# Patient Record
Sex: Female | Born: 1965 | Race: Black or African American | Hispanic: No | Smoking: Never smoker
Health system: Southern US, Community
[De-identification: ages and names within clinical notes are randomized; demographics above are authoritative.]

## PROBLEM LIST (undated history)

## (undated) DIAGNOSIS — N83209 Unspecified ovarian cyst, unspecified side: Secondary | ICD-10-CM

## (undated) DIAGNOSIS — Z9889 Other specified postprocedural states: Secondary | ICD-10-CM

## (undated) DIAGNOSIS — T8859XA Other complications of anesthesia, initial encounter: Secondary | ICD-10-CM

## (undated) DIAGNOSIS — I493 Ventricular premature depolarization: Secondary | ICD-10-CM

## (undated) DIAGNOSIS — D219 Benign neoplasm of connective and other soft tissue, unspecified: Secondary | ICD-10-CM

## (undated) DIAGNOSIS — T4145XA Adverse effect of unspecified anesthetic, initial encounter: Secondary | ICD-10-CM

## (undated) DIAGNOSIS — I452 Bifascicular block: Secondary | ICD-10-CM

## (undated) DIAGNOSIS — R112 Nausea with vomiting, unspecified: Secondary | ICD-10-CM

## (undated) DIAGNOSIS — I499 Cardiac arrhythmia, unspecified: Secondary | ICD-10-CM

## (undated) DIAGNOSIS — L03114 Cellulitis of left upper limb: Secondary | ICD-10-CM

## (undated) DIAGNOSIS — I10 Essential (primary) hypertension: Secondary | ICD-10-CM

## (undated) DIAGNOSIS — D869 Sarcoidosis, unspecified: Secondary | ICD-10-CM

## (undated) DIAGNOSIS — I209 Angina pectoris, unspecified: Secondary | ICD-10-CM

## (undated) DIAGNOSIS — M009 Pyogenic arthritis, unspecified: Secondary | ICD-10-CM

## (undated) HISTORY — PX: UTERINE FIBROID SURGERY: SHX826

## (undated) HISTORY — DX: Essential (primary) hypertension: I10

## (undated) HISTORY — DX: Unspecified ovarian cyst, unspecified side: N83.209

## (undated) HISTORY — DX: Bifascicular block: I45.2

## (undated) HISTORY — DX: Ventricular premature depolarization: I49.3

## (undated) HISTORY — DX: Sarcoidosis, unspecified: D86.9

## (undated) HISTORY — DX: Benign neoplasm of connective and other soft tissue, unspecified: D21.9

---
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
## 1984-12-13 HISTORY — PX: RIGHT OOPHORECTOMY: SHX2359

## 1984-12-13 HISTORY — PX: OVARIAN CYST REMOVAL: SHX89

## 1986-12-13 HISTORY — PX: APPENDECTOMY: SHX54

## 1989-12-13 HISTORY — PX: TUBAL LIGATION: SHX77

## 2001-06-06 ENCOUNTER — Emergency Department (HOSPITAL_COMMUNITY): Admission: EM | Admit: 2001-06-06 | Discharge: 2001-06-07 | Payer: Self-pay | Admitting: *Deleted

## 2001-07-12 ENCOUNTER — Encounter: Payer: Self-pay | Admitting: Obstetrics and Gynecology

## 2001-07-12 ENCOUNTER — Ambulatory Visit (HOSPITAL_COMMUNITY): Admission: RE | Admit: 2001-07-12 | Discharge: 2001-07-12 | Payer: Self-pay | Admitting: Obstetrics and Gynecology

## 2002-03-18 ENCOUNTER — Emergency Department (HOSPITAL_COMMUNITY): Admission: EM | Admit: 2002-03-18 | Discharge: 2002-03-18 | Payer: Self-pay | Admitting: Emergency Medicine

## 2002-06-25 ENCOUNTER — Encounter: Payer: Self-pay | Admitting: Obstetrics and Gynecology

## 2002-06-25 ENCOUNTER — Inpatient Hospital Stay (HOSPITAL_COMMUNITY): Admission: AD | Admit: 2002-06-25 | Discharge: 2002-06-25 | Payer: Self-pay | Admitting: Obstetrics and Gynecology

## 2003-08-02 ENCOUNTER — Other Ambulatory Visit: Admission: RE | Admit: 2003-08-02 | Discharge: 2003-08-02 | Payer: Self-pay | Admitting: Obstetrics & Gynecology

## 2004-07-27 ENCOUNTER — Encounter (INDEPENDENT_AMBULATORY_CARE_PROVIDER_SITE_OTHER): Payer: Self-pay | Admitting: Specialist

## 2004-07-27 ENCOUNTER — Inpatient Hospital Stay (HOSPITAL_COMMUNITY): Admission: RE | Admit: 2004-07-27 | Discharge: 2004-07-29 | Payer: Self-pay | Admitting: *Deleted

## 2005-06-30 ENCOUNTER — Other Ambulatory Visit: Admission: RE | Admit: 2005-06-30 | Discharge: 2005-06-30 | Payer: Self-pay | Admitting: Obstetrics and Gynecology

## 2007-12-22 ENCOUNTER — Emergency Department (HOSPITAL_COMMUNITY): Admission: EM | Admit: 2007-12-22 | Discharge: 2007-12-22 | Payer: Self-pay | Admitting: Family Medicine

## 2010-10-24 ENCOUNTER — Emergency Department (HOSPITAL_COMMUNITY): Admission: EM | Admit: 2010-10-24 | Discharge: 2010-10-24 | Payer: Self-pay | Admitting: Emergency Medicine

## 2010-10-26 ENCOUNTER — Emergency Department (HOSPITAL_COMMUNITY): Admission: EM | Admit: 2010-10-26 | Discharge: 2010-10-26 | Payer: Self-pay | Admitting: Family Medicine

## 2011-01-02 ENCOUNTER — Encounter: Payer: Self-pay | Admitting: Obstetrics and Gynecology

## 2011-01-03 ENCOUNTER — Encounter: Payer: Self-pay | Admitting: Obstetrics and Gynecology

## 2011-04-30 NOTE — H&P (Signed)
[name], [name]                           ACCOUNT NO.:  [id number]   MEDICAL RECORD NO.:  [id number]                   PATIENT TYPE:  INP   LOCATION:  NA                                   [facility]  [facility]   PHYSICIAN:  [name], M.D.               DATE OF BIRTH:  [birth year]   DATE OF ADMISSION:  07/27/2004  DATE OF DISCHARGE:                                HISTORY & PHYSICAL   PREOPERATIVE DIAGNOSES:  Symptomatic uterine fibroids, desires conservative  surgical therapy.   POSTOPERATIVE DIAGNOSES:  Symptomatic uterine fibroids, desires conservative  surgical therapy.   PROCEDURE:  Abdominal myomectomy.   HISTORY OF PRESENT ILLNESS:  A 45 year old African-American female status  post tubal ligation with complaints of heavy menstrual bleeding,  dysmenorrhea and uterine fibroids.  She is interested in one additional  pregnancy. She states she has a history of endometriosis noted at previous  ovarian cyst removal.  Also status post tubal ligation. The patient has  declined a combined procedure with a fertility specialist for myomectomy and  tubal reversal despite my recommendations to do so.  Her main complaint at  this time is the pain and bleeding and does not desire to address the  fertility issues at this time.   Pelvic ultrasound in the office shows three dominant fibroids in the 5-9 cm  range. The endometrium appeared thin.   PAST MEDICAL HISTORY:  Hypertension, endometriosis and fibroids.   PAST SURGICAL HISTORY:  Appendectomy, ovarian cyst removal, TAB x1, vaginal  delivery x1, tubal ligation.   MEDICATIONS:  Hydrochlorothiazide.   ALLERGIES:  None.   SOCIAL HISTORY:  No alcohol, tobacco or other drugs.   FAMILY HISTORY:  Noncontributory.   REVIEW OF SYMPTOMS:  Otherwise negative.   PHYSICAL EXAMINATION:  VITAL SIGNS:  Blood pressure 140/100, weight 284,  height 5 foot 10.  GENERAL:  Alert in no acute distress.  SKIN:  Warm and dry without lesions.  HEART:  Regular rate and rhythm.  LUNGS:  Clear to auscultation.  ABDOMEN:  Obese, liver and spleen are normal.  No hernia.  A vertical  midline incision noted without hernia. Mass is palpable at the umbilicus.  LYMPH NODE SURVEY:  Negative in the neck, axilla and groin.  BREASTS:  No dominant masses, nipple discharge or adenopathy.  PELVIC:  Normal external genitalia, vagina and cervix normal. The uterus is  enlarged consistent with fibroids approximately 20 week size, no adnexal  masses or tenderness.  Recent Pap smear in the office was normal.   ASSESSMENT:  Symptomatic uterine fibroids with menorrhagia and dysmenorrhea.  Desires conservative surgical management.   PLAN:  Abdominal myomectomy.  The operative risk discussed including  infection, bleeding, damage to surrounding organs, and potential need for  conversion to hysterectomy due to bleeding. All questions answered. The  patient wished to proceed.  [name], M.D.    WBD/MEDQ  D:  07/23/2004  T:  07/23/2004  Job:  [id number]

## 2011-04-30 NOTE — Discharge Summary (Signed)
[name], [name]                           ACCOUNT NO.:  [id number]   MEDICAL RECORD NO.:  [id number]                   PATIENT TYPE:  INP   LOCATION:  9310                                 [facility]ILITY:  [facility]   PHYSICIAN:  [place] B. Earlene Plater, M.D.               DATE OF BIRTH:  November 08, 1966   DATE OF ADMISSION:  07/27/2004  DATE OF DISCHARGE:                                 DISCHARGE SUMMARY   PREOPERATIVE DIAGNOSES:  1. Symptomatic uterine fibroids.  2. Desires uterine retention.   POSTOPERATIVE DIAGNOSES:  1. Symptomatic uterine fibroids.  2. Desires uterine retention.   PROCEDURE:  Abdominal myomectomy.   HISTORY OF PRESENT ILLNESS:  For complete details please see the History and  Physical in the chart.  Briefly, the patient is admitted for surgical  management of symptomatic uterine fibroids not responding to medical  management with associated menorrhagia and dysmenorrhea.  The patient  desires the possibility for future fertility and therefore requesting  abdominal myomectomy.   HOSPITAL COURSE:  On the day of admission the patient underwent abdominal  myomectomy.  The findings at the time of surgery included an approximately  20-week-size fibroid uterus with multiple subserosal myomas.  In addition,  there was a single, large, pedunculated myoma from the fundus.  The uterine  cavity was entered on removing a submucosal fibroid.  The uterus was  approximately debulked by 50% from its preoperative volume.  As there were  deeper, more inferior myomas that in my opinion would have significantly  increased the chance of bleeding given their proximity to the uterine  arteries and therefore these were left in situ given the patient's desire  for uterine retention.   Postoperatively the patient rapidly regained her ability to ambulate, void,  and tolerate a regular diet.  She was discharged home on postoperative day  #2 in satisfactory condition.   DISCHARGE MEDICATIONS:  1.  Ferrous sulfate 325 mg p.o. b.i.d.  2. Tylox one to two p.o. q.4-6h. p.r.n. pain.   DISPOSITION AT DISCHARGE:  Satisfactory.   DISCHARGE INSTRUCTIONS:  Standard instructions were given.  Follow up 1 week  at [facility] for staple removal.                                               Gerri Spore B. Earlene Plater, M.D.   WBD/MEDQ  D:  07/29/2004  T:  07/29/2004  Job:  [id number]

## 2011-04-30 NOTE — Op Note (Signed)
[name], [name]                           ACCOUNT NO.:  [id number]   MEDICAL RECORD NO.:  [id number]                   PATIENT TYPE:  INP   LOCATION:  9399                                 [facility]ILITY[facility]   PHYSICIAN:  Gerri Spore B. Earlene Plater, M.D.               DATE OF BIRTH:  [birth year]   DATE OF PROCEDURE:  07/27/2004  DATE OF DISCHARGE:                                 OPERATIVE REPORT   PREOPERATIVE DIAGNOSIS:  Abnormal bleeding.  Uterine fibroids.   POSTOPERATIVE DIAGNOSIS:  Abnormal bleeding.  Uterine fibroids.   OPERATION PERFORMED:  Abdominal myomectomy.   SURGEON:  Chester Holstein. Earlene Plater, M.D.   ASSISTANT:  Genia Del, M.D.   ANESTHESIA:  General.   FINDINGS:  Approximately 20-week size fibroid uterus with multiple  subserosal fibroids and a pedunculated anterior fibroid.  Tubes and ovaries  were poorly visualized due to the bulk of the uterus.  There were several  fibroids that could not be safely removed, primarily located in the anterior  lower uterine segment and posteriorly.  Concern was for excessive bleeding  and injury to surrounding organs.   ESTIMATED BLOOD LOSS:  75 mL.   SPECIMENS:  Approximately 340 g of uterine fibroids removed.   INDICATIONS FOR PROCEDURE:  Patient with a history of heavy menstrual  bleeding associated with dysmenorrhea and known uterine fibroids.  Has had a  previous tubal ligation but is considering in vitro with donor eggs or tubal  reversal and therefore wished to retain fertility but desired operative  management of her symptomatic fibroids.  The patient was informed prior to  surgery that it would be possible that bleeding was require hysterectomy or  that complete debulking might not occur.  Operative risks were discussed  including infection, bleeding, damage to surrounding organs.   DESCRIPTION OF PROCEDURE:  The patient was taken to the operating room and  general anesthesia obtained.  She was prepped and draped in standard  fashion.  Inflow catheter inserted into the bladder.  Vertical midline  incision made and carried to the fascia.  The fascia was divided sharply and  elevated.  The posterior sheath and peritoneum were entered sharply.  There  were underlying omental adhesions from a previous vertical midline scar  which were taken down with cautery and Metzenbaum scissors.  The uterus was  elevated through the incision and the above findings noted.  There appeared  to be two or three dominant fundal fibroids in addition to the pedunculated  fundal fibroid that were at least half of the bulk of her uterus.  There  were some more deeply located inferior fibroids around the lower uterine  segment anteriorly and posteriorly.   The fundus was injected with dilute vasopressin and incised with the Bovie.  The capsule was developed with sharp and blunt technique and the most  dominant fibroid was removed. This allowed access to deeper fibroids which  were  removed in a similar fashion.  The uterine cavity was entered with the  deepest of these approximately 2 cm defects.  The pedunculated myoma was  also removed from the fundus with the Bovie.   After further inspection, I did not feel it was safe to remove the  additional fibroids given the patient's strong desire to retain fertility.  In addition, access to these would be difficult due to her morbid obesity.  Therefore, these fibroids were left in situ.  The endometrial cavity was  closed with a running stitch of 2-0 Vicryl.  The deeper myometrial tissue  was reapproximated with interrupted figure-of-eight sutures of o0 Vicryl.  The serosa was reapproximated with a baseball stitch of 2-0 Vicryl.  Hemostasis obtained.  A layer of InterCeed was placed over the uterine  incision and the bowel returned to the anatomical position and omentum laid  overtop of the InterCeed.   The fascia was closed with a running stitch of double stranded 0 PDS suture.  The  subcutaneous tissue was reapproximated with 0 plain suture.  The skin  was closed with staples.  The patient tolerated the procedure well.  There  were no complications.  She was taken to recovery room awake, alert in  stable condition.                                               Gerri Spore B. Earlene Plater, M.D.    WBD/MEDQ  D:  07/27/2004  T:  07/27/2004  Job:  [id number]

## 2011-05-27 ENCOUNTER — Ambulatory Visit (INDEPENDENT_AMBULATORY_CARE_PROVIDER_SITE_OTHER): Payer: Managed Care, Other (non HMO) | Admitting: Cardiology

## 2011-05-27 ENCOUNTER — Encounter: Payer: Self-pay | Admitting: Cardiology

## 2011-05-27 VITALS — BP 130/78 | HR 86 | Ht 71.0 in | Wt 298.5 lb

## 2011-05-27 DIAGNOSIS — I493 Ventricular premature depolarization: Secondary | ICD-10-CM

## 2011-05-27 DIAGNOSIS — I4949 Other premature depolarization: Secondary | ICD-10-CM

## 2011-05-27 DIAGNOSIS — I452 Bifascicular block: Secondary | ICD-10-CM

## 2011-05-27 DIAGNOSIS — I10 Essential (primary) hypertension: Secondary | ICD-10-CM | POA: Insufficient documentation

## 2011-05-27 NOTE — Assessment & Plan Note (Addendum)
She has frequent PVCs. We need to rule out structural heart disease especially given the history of hypertrophic cardiomyopathy in her son. If she has no structural heart disease then these PVCs may be benign and we may not need to treat them unless she has significant symptoms in which case a beta blocker may be warranted. We will schedule her for an echocardiogram.

## 2011-05-27 NOTE — Progress Notes (Signed)
[name] Date of Birth: 02-15-1966   History of Present Illness: [name] is a pleasant 50 her old [place] American female who is seen at the request of Dr. Gertie Gowda for bradycardia and an abnormal ECG. Patient reports that recently she has been feeling weak and tired. She thought that her sugars were high but this was checked and was normal. She was checking her blood pressure at home and her machine registered a pulse rate of 40. She was seen by Dr. Gertie Gowda and ECG was abnormal. She states she has had a few episodes where she felt like she might pass out. These occurred when she was going up her stairs. She has no known history of cardiac disease. She denies any significant palpitations. It is interesting to note that her son has been diagnosed with hypertrophic cardiomyopathy.  Current Outpatient Prescriptions on File Prior to Visit  Medication Sig Dispense Refill  . Ascorbic Acid (VITAMIN C PO) Take 1 tablet by mouth daily.        Marland Kitchen lisinopril-hydrochlorothiazide (PRINZIDE,ZESTORETIC) 10-12.5 MG per tablet Take 1 tablet by mouth daily.        . Omega-3 Fatty Acids (FISH OIL PO) Take 1 capsule by mouth daily.        Marland Kitchen terbinafine (LAMISIL) 250 MG tablet       . Thiamine HCl (VITAMIN B-1 PO) Take by mouth daily.          No Known Allergies  Past Medical History  Diagnosis Date  . Hypertension   . Ovarian cyst   . Fibroid tumor     Past Surgical History  Procedure Date  . Ovarian cyst removal   . Tonsillectomy   . Appendectomy   . Total abdominal hysterectomy w/ bilateral salpingoophorectomy     right    History  Smoking status  . Never Smoker   Smokeless tobacco  . Not on file    History  Alcohol Use No    Family History  Problem Relation Age of Onset  . Anemia Sister   . Anemia Sister   . Diabetes Mother   . Heart disease Mother   . Hypertension Mother   . Diabetes Father   . Hypertension Father   . Scleroderma Father     Review of  Systems: The review of systems is positive for intermittent lightheadedness.  She denies any chest pain or shortness of breath. She's had no history of TIA or stroke. She does have a history of hypertension but this has been controlled.All other systems were reviewed and are negative.  Physical Exam: BP 130/78  Pulse 86  Ht 5\' 11"  (1.803 m)  Wt 298 lb 8 oz (135.399 kg)  BMI 41.63 kg/m2 She is a pleasant overweight black female in no acute distress. Normocephalic, atraumatic. Pupils are equal round and reactive to light and accommodation. Extraocular movements are clear. Sclera are clear. Oropharynx is clear with good dentition. Neck is supple without JVD, adenopathy, thyromegaly, or bruits. Lungs are clear. Cardiac exam reveals a regular rate and rhythm with frequent extrasystoles. She has no gallop, murmur, or click. Abdomen is obese, soft, nontender. She has no hepatosplenomegaly masses or bruits. Extremities are without edema. Pulses are 2+ and symmetric. Skin is warm and dry. She is alert and oriented x3. Cranial nerves II through XII are intact. 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 She has frequent PVCs.  Assessment /  Plan:

## 2011-05-27 NOTE — Assessment & Plan Note (Signed)
She has a right bundle branch block and left anterior fascicular block. Her pulse rate is normal today. I suspect that her low pulse reading on her home blood pressure monitor was related to her frequent PVCs that did not register. We will have her wear a Holter monitor to rule out significant bradycardia or pauses. Patient reports she has had lab work done recently including thyroid studies and chemistries.

## 2011-05-27 NOTE — Patient Instructions (Signed)
We will have you wear a monitor for 24 hours.  We will schedule you for an Echocardiogram.  We will follow up with you after these studies.

## 2011-05-28 ENCOUNTER — Encounter: Payer: Self-pay | Admitting: Cardiology

## 2011-06-03 ENCOUNTER — Other Ambulatory Visit (HOSPITAL_COMMUNITY): Payer: Managed Care, Other (non HMO) | Admitting: Radiology

## 2011-06-07 ENCOUNTER — Emergency Department (HOSPITAL_COMMUNITY): Payer: Managed Care, Other (non HMO)

## 2011-06-07 ENCOUNTER — Telehealth: Payer: Self-pay | Admitting: *Deleted

## 2011-06-07 ENCOUNTER — Inpatient Hospital Stay (HOSPITAL_COMMUNITY)
Admission: EM | Admit: 2011-06-07 | Discharge: 2011-06-08 | DRG: 313 | Disposition: A | Discharge status: Home / Self Care | Payer: Managed Care, Other (non HMO) | Attending: Cardiology | Admitting: Cardiology

## 2011-06-07 DIAGNOSIS — R0989 Other specified symptoms and signs involving the circulatory and respiratory systems: Secondary | ICD-10-CM | POA: Diagnosis present

## 2011-06-07 DIAGNOSIS — I10 Essential (primary) hypertension: Secondary | ICD-10-CM | POA: Diagnosis present

## 2011-06-07 DIAGNOSIS — I452 Bifascicular block: Secondary | ICD-10-CM | POA: Diagnosis present

## 2011-06-07 DIAGNOSIS — R55 Syncope and collapse: Secondary | ICD-10-CM | POA: Diagnosis present

## 2011-06-07 DIAGNOSIS — R0789 Other chest pain: Principal | ICD-10-CM | POA: Diagnosis present

## 2011-06-07 DIAGNOSIS — R0609 Other forms of dyspnea: Secondary | ICD-10-CM | POA: Diagnosis present

## 2011-06-07 DIAGNOSIS — R079 Chest pain, unspecified: Secondary | ICD-10-CM

## 2011-06-07 LAB — DIFFERENTIAL
Basophils Relative: 1 % (ref 0–1)
Eosinophils Absolute: 0.4 10*3/uL (ref 0.0–0.7)
Monocytes Relative: 7 % (ref 3–12)
Neutrophils Relative %: 58 % (ref 43–77)

## 2011-06-07 LAB — BASIC METABOLIC PANEL
CO2: 27 mEq/L (ref 19–32)
Calcium: 9.9 mg/dL (ref 8.4–10.5)
Creatinine, Ser: 0.88 mg/dL (ref 0.50–1.10)
Glucose, Bld: 99 mg/dL (ref 70–99)

## 2011-06-07 LAB — CK TOTAL AND CKMB (NOT AT ARMC): Total CK: 343 U/L — ABNORMAL HIGH (ref 7–177)

## 2011-06-07 LAB — TROPONIN I: Troponin I: <0.3 ng/mL (ref <0.30)

## 2011-06-07 LAB — CBC
MCH: 28.5 pg (ref 26.0–34.0)
Platelets: 252 10*3/uL (ref 150–400)
RBC: 4.99 MIL/uL (ref 3.87–5.11)
WBC: 6.4 10*3/uL (ref 4.0–10.5)

## 2011-06-07 NOTE — Telephone Encounter (Signed)
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 Pt was referred to er per dr Elease Hashimoto. Pt said she would go.

## 2011-06-08 ENCOUNTER — Inpatient Hospital Stay (HOSPITAL_COMMUNITY): Payer: Managed Care, Other (non HMO)

## 2011-06-08 DIAGNOSIS — R072 Precordial pain: Secondary | ICD-10-CM

## 2011-06-08 LAB — CARDIAC PANEL(CRET KIN+CKTOT+MB+TROPI)
CK, MB: 2.4 ng/mL (ref 0.3–4.0)
Relative Index: 1 (ref 0.0–2.5)
Total CK: 272 U/L — ABNORMAL HIGH (ref 7–177)
Total CK: 243 U/L — ABNORMAL HIGH (ref 7–177)
Troponin I: <0.3 ng/mL (ref <0.30)

## 2011-06-08 LAB — D-DIMER, QUANTITATIVE: D-Dimer, Quant: 0.99 ug/mL-FEU — ABNORMAL HIGH (ref 0.00–0.48)

## 2011-06-08 MED ORDER — IOHEXOL 350 MG/ML SOLN
75.0000 mL | Freq: Once | INTRAVENOUS | Status: AC | PRN
  Administered 2011-06-08: 75 mL via INTRAVENOUS

## 2011-06-10 ENCOUNTER — Other Ambulatory Visit (HOSPITAL_COMMUNITY): Payer: Managed Care, Other (non HMO) | Admitting: Radiology

## 2011-06-10 NOTE — Consult Note (Signed)
NAMEREMEDY, CORPORAN NO.:  [id number]  MEDICAL RECORD NO.:  [id number]  LOCATION:  2041                         FACILITY:  [facility]  PHYSICIAN:  Madolyn Frieze. Jens Som, MD, FACCDATE OF BIRTH:  09/04/1966  DATE OF CONSULTATION:  06/07/2011 DATE OF DISCHARGE:                                CONSULTATION   HISTORY:  The patient is a 44-year female with past medical history of hypertension who I am asked to evaluate for chest pain.  The patient was recently seen by her primary care physician and noted to be bradycardic. She was referred to Dr. Peter Swaziland and he saw her on May 27, 2011. At that time, her heart rate was normal.  He did note that her son has hypertrophic obstructive cardiomyopathy.  He ordered a Holter monitor which has been removed, but the results are not available.  He also ordered an echocardiogram which is to be performed on June 06, 2011. The patient presented to the emergency room tonight, predominantlycomplained of chest pain.  The pain is in the substernal location without radiation.  It is described as an "ache."  It began on June 21 and has been continuous since.  It is not pleuritic or positional nor is it related to food.  It is not exertional.  There were no associated symptoms.  She also describes a "sharp pain" when she takes of breath. Because of her chest pain, Cardiology was also asked to further evaluate.  Note, she also describes increased dyspnea on exertion for 6 months as well as fatigue.  She also has had occasional dizzy spells, for which the monitor was ordered.  These dizzy spells lasts approximately 15 minutes and resolved spontaneously.  There is no associated palpitations.  There is no associated chest pain or shortness of breath.  She does not have risk factors for a pulmonary embolus.  MEDICATIONS:  Lisinopril 10 mg daily.  ALLERGIES:  She has no known drug allergies.  SOCIAL HISTORY:  She does not smoke nor does she  consume alcohol.  FAMILY HISTORY:  Significant for a son who had hypertrophic obstructive cardiomyopathy.  PAST MEDICAL HISTORY:  Significant for hypertension.  There is no diabetes mellitus or hyperlipidemia.  She does have a history of ovarian cyst removed.  She has also had tonsillectomy and appendectomy.  REVIEW OF SYSTEMS:  She denies any headaches or fevers or chills.  She has had a nonproductive cough.  There is no recent hemoptysis.  There is no dysphagia, odynophagia, melena, or hematochezia.  There is no dysuria or hematuria.  There is no rash or seizure activity.  There is no orthopnea, PND, or pedal edema.  She does have dyspnea on exertion as well as fatigue.  Remaining systems are negative.  PHYSICAL EXAMINATION:  VITAL SIGNS:  Shows a blood pressure of 131/66 and her pulse is 82.  Her temperature is 98.4. GENERAL:  She is well-developed and somewhat obese.  She is no acute distress at present.  Her skin is warm and dry.  She does not appear to be depressed.  There is no peripheral clubbing. BACK:  Normal. HEENT:  Normal with normal eyelids. NECK:  Supple with  normal upstroke bilaterally.  No bruits heard.  There is no jugular venous distention and no thyromegaly is noted. CHEST:  Clear to auscultation.  No expansion. CARDIOVASCULAR:  Regular rate and rhythm.  Normal S1 and S2.  There are no murmurs, rubs, or gallops noted.  There is no change with Valsalva. ABDOMEN:  Nontender, nondistended.  Positive bowel sounds.  No hepatosplenomegaly.  No mass appreciated.  There was no abdominal bruit. She has 2+ femoral pulses bilaterally.  No bruits. EXTREMITIES:  No edema.  I could palpate no cords.  She has 2+ dorsalis pedis pulses bilaterally. NEUROLOGIC:  Grossly intact.  LABORATORIES:  Potassium of 3.9.  Hemoglobin and hematocrit of 14.2 and 41.3 respectively.  Her chest x-ray shows mild cardiac enlargement and bronchitic changes.  Her electrocardiogram shows a normal  sinus rhythm with right bundle-branch block and left anterior fascicular block.  DIAGNOSES: 1. Chest pain - the patient's symptoms are unlikely to be cardiac     related.  She has had chest pain continuously for 4 days and her     initial markers are negative.  Her symptoms may be musculoskeletal.     We will admit and rule out myocardial infarction with serial     enzymes.  If negative, I do not think further ischemia evaluation     is warranted. 2. History of presyncope - etiology unclear.  She does have a     bifascicular block on electrocardiogram.  We will watch her on     telemetry for 24 hours and check an echocardiogram tomorrow     morning.  Note, her son does have a history of hypertrophic     cardiomyopathy.  If her echo is normal and telemetry unremarkable,     then be could potentially discharge tomorrow morning.  We also need     to obtain the results for recent monitor.  It would also be     worthwhile giving her bifascicular block to check an ACE level to     screen for sarcoid. 3. Hypertension - she will continue on her lisinopril. 4. Dyspnea - we will check a D-dimer, although I think pulmonary     embolus is unlikely.     Madolyn Frieze Jens Som, MD, [facility]     BSC/MEDQ  D:  06/07/2011  T:  06/08/2011  Job:  [id number]  Electronically Signed by Olga Millers MD [facility] on 06/10/2011 05:51:49 AM

## 2011-06-12 ENCOUNTER — Emergency Department (HOSPITAL_COMMUNITY): Payer: Managed Care, Other (non HMO)

## 2011-06-12 ENCOUNTER — Emergency Department (HOSPITAL_COMMUNITY)
Admission: EM | Admit: 2011-06-12 | Discharge: 2011-06-12 | Disposition: A | Discharge status: Home / Self Care | Payer: Managed Care, Other (non HMO) | Attending: Emergency Medicine | Admitting: Emergency Medicine

## 2011-06-12 DIAGNOSIS — J3489 Other specified disorders of nose and nasal sinuses: Secondary | ICD-10-CM | POA: Insufficient documentation

## 2011-06-12 DIAGNOSIS — R509 Fever, unspecified: Secondary | ICD-10-CM | POA: Insufficient documentation

## 2011-06-12 DIAGNOSIS — R0989 Other specified symptoms and signs involving the circulatory and respiratory systems: Secondary | ICD-10-CM | POA: Insufficient documentation

## 2011-06-12 DIAGNOSIS — R0602 Shortness of breath: Secondary | ICD-10-CM | POA: Insufficient documentation

## 2011-06-12 DIAGNOSIS — R5381 Other malaise: Secondary | ICD-10-CM | POA: Insufficient documentation

## 2011-06-12 DIAGNOSIS — Z79899 Other long term (current) drug therapy: Secondary | ICD-10-CM | POA: Insufficient documentation

## 2011-06-12 DIAGNOSIS — I10 Essential (primary) hypertension: Secondary | ICD-10-CM | POA: Insufficient documentation

## 2011-06-12 DIAGNOSIS — R0609 Other forms of dyspnea: Secondary | ICD-10-CM | POA: Insufficient documentation

## 2011-06-12 DIAGNOSIS — R079 Chest pain, unspecified: Secondary | ICD-10-CM | POA: Insufficient documentation

## 2011-06-12 DIAGNOSIS — R059 Cough, unspecified: Secondary | ICD-10-CM | POA: Insufficient documentation

## 2011-06-12 DIAGNOSIS — J189 Pneumonia, unspecified organism: Secondary | ICD-10-CM | POA: Insufficient documentation

## 2011-06-12 DIAGNOSIS — R05 Cough: Secondary | ICD-10-CM | POA: Insufficient documentation

## 2011-06-12 LAB — BASIC METABOLIC PANEL
BUN: 15 mg/dL (ref 6–23)
CO2: 22 mEq/L (ref 19–32)
Calcium: 9.9 mg/dL (ref 8.4–10.5)
GFR calc non Af Amer: >60 mL/min (ref >60)
Glucose, Bld: 170 mg/dL — ABNORMAL HIGH (ref 70–99)
Potassium: 3.8 mEq/L (ref 3.5–5.1)

## 2011-06-12 LAB — URINALYSIS, ROUTINE W REFLEX MICROSCOPIC
Protein, ur: 30 mg/dL — AB
Urobilinogen, UA: 1 mg/dL (ref 0.0–1.0)

## 2011-06-12 LAB — DIFFERENTIAL
Basophils Absolute: 0 10*3/uL (ref 0.0–0.1)
Basophils Relative: 0 % (ref 0–1)
Eosinophils Absolute: 0.1 10*3/uL (ref 0.0–0.7)
Eosinophils Relative: 0 % (ref 0–5)
Monocytes Absolute: 0.8 10*3/uL (ref 0.1–1.0)

## 2011-06-12 LAB — URINE MICROSCOPIC-ADD ON

## 2011-06-12 LAB — CBC
MCHC: 34.3 g/dL (ref 30.0–36.0)
RDW: 15 % (ref 11.5–15.5)

## 2011-06-14 NOTE — Discharge Summary (Addendum)
[name], APPERSON NO.:  [id number]  MEDICAL RECORD NO.:  [id number]  LOCATION:  2041                         FACILITY:  [facility]  PHYSICIAN:  Peter M. Swaziland, M.D.  DATE OF BIRTH:  07-Dec-1966  DATE OF ADMISSION:  06/07/2011 DATE OF DISCHARGE:  06/08/2011                              DISCHARGE SUMMARY   DISCHARGE DIAGNOSES: 1. Chest pain.     a.     Negative troponins x3, elevated CK up to 343.     b.     Elevated D-dimer 0.99, negative CT angio for pulmonary      embolism on June 08, 2011.     c.     Normal left ventricular function by echo with preliminary      read by Dr. Swaziland, showing mild MR and TR, chest pain felt      atypical. 2. Frequent ventricular ectopy, initiated on beta-blockade this     admission. 3. Bifascicular block. 4. Mediastinal and hilar lymphadenopathy on CT angio of June 08, 2011,     question sarcoidosis versus lymphoma, for outpatient pulmonary     evaluation.  HOSPITAL COURSE:  [name] is a 45 year old female with a past history of hypertension, Dr. _________ evaluated in the office on May 27, 2011. She had recently seen her PCP and was noted to be bradycardic, however, upon evaluation by Dr. Swaziland, she had a normal heart rate.  Holter monitor is pending.  She presented to the ED complaining of a chest ache sensation, nonexertional, like a sharp pain when she takes the deep breath.  She also describes increased dyspnea on exertion for 6 months as well as fatigue.  She was noted to have a fun, who had hypertrophic obstructive cardiomyopathy.  Therefore, she was admitted to Cardiology Service.  She was watched on telemetry and was found to have frequent ventricular ectopy.  A 2-D echocardiogram was obtained and preliminary read by Dr. Swaziland, shows normal LV function as well as mild MR and TR. D-dimer was checked which was mildly elevated at 0.99 and CT angio subsequently showed no evidence of PE, however, did note  mediastinal and hilar lymphadenopathy with sarcoidosis and lymphoma that should be considered.  Dr. 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  ACE level was normal here in the hospital.  Cardiac enzymes were also cycled which were negative x3 with exception of an elevated CK up to 343, but otherwise negative MB and troponin.  Dr. Swaziland, felt that she could be safely discharged today with outpatient followup with pulmonary evaluation.  He believes she may benefit from low-dose beta-blockade therapy given her bifascicular block.  He postulated that it is possible that her bradycardia was pseudo-bradycardia in the presence of frequent ventricular ectopy.  DISCHARGE LABS:  WBC 6.4, hemoglobin 14.2, hematocrit 41.3, and platelet count 252.  Sodium 139, potassium 3.8, chloride 104, CO2 of 27, glucose 99, BUN 12, creatinine 0.88.  D-dimer 0.99, ACE level 13.  STUDIES: 1. CT angio on June 08, 2011 showed no pulmonary emboli.  Mediastinal     hilar lymphadenopathy.  Sarcoidosis and lymphoma should be  considered. 2. Chest x-ray on June 07, 2011 showed mild cardiomegaly and     bronchitic changes.  DISCHARGE MEDICATIONS:  The patient will be discharged in stable condition to home and is instructed to increase activity slowly.  She follow a low-sodium heart-healthy diet and will follow up with Dr. Swaziland in approximately 2-4 weeks in our office.  We will call her with this appointment.  She will follow with Dr. Shelle Iron on June 29, 2011 at 1:45 p.m.  She is also instructed to call or return if she develops any further chest pain, shortness of dizziness, sweating, or excessive weakness.  DURATION OF DISCHARGE ENCOUNTER:  Greater than 30 minutes including physician and PA time.     Ronie Spies, P.A.C.   ______________________________ Peter M. Swaziland, M.D.    DD/MEDQ  D:  06/08/2011  T:  06/09/2011  Job:  [id number]  Electronically  Signed by PETER Swaziland M.D. on 06/14/2011 09:37:05 AM Electronically Signed by Ronie Spies  on 06/17/2011 01:31:53 PM

## 2011-06-14 NOTE — Discharge Summary (Addendum)
  [name], [name] NO.:  [id number]  MEDICAL RECORD NO.:  [id number]  LOCATION:  2041                         FACILITY:  [facility]  PHYSICIAN:  Peter M. Sw[place]il[place], M.D.  DATE OF BIRTH:  08-17-1966  DATE OF ADMISSION:  06/07/2011 DATE OF DISCHARGE:  06/08/2011                              DISCHARGE SUMMARY   ADDENDUM  DISCHARGE MEDICATIONS: 1. Lopressor 25 mg half tablet b.i.d. 2. Lisinopril 10 mg daily.     Ronie Spies, P.A.C.   ______________________________ Peter M. [place], M.D.    DD/MEDQ  D:  06/08/2011  T:  06/09/2011  Job:  [id number]  Electronically Signed by PETER Sw[place]iland M.D. on 06/14/2011 09:36:59 AM Electronically Signed by Ronie Spies  on 06/17/2011 01:32:00 PM

## 2011-06-17 ENCOUNTER — Ambulatory Visit: Payer: Managed Care, Other (non HMO) | Admitting: Cardiology

## 2011-06-29 ENCOUNTER — Institutional Professional Consult (permissible substitution): Payer: Managed Care, Other (non HMO) | Admitting: Pulmonary Disease

## 2011-06-30 ENCOUNTER — Telehealth: Payer: Self-pay | Admitting: Cardiology

## 2011-06-30 NOTE — Telephone Encounter (Signed)
[phone] latest CT Report, OV, ECHO, STRESS, CATH

## 2011-07-01 ENCOUNTER — Encounter: Payer: Self-pay | Admitting: Pulmonary Disease

## 2011-07-01 ENCOUNTER — Telehealth: Payer: Self-pay | Admitting: Pulmonary Disease

## 2011-07-01 ENCOUNTER — Ambulatory Visit (INDEPENDENT_AMBULATORY_CARE_PROVIDER_SITE_OTHER): Payer: Managed Care, Other (non HMO) | Admitting: Pulmonary Disease

## 2011-07-01 VITALS — BP 148/100 | HR 80 | Temp 98.4°F | Ht 71.0 in | Wt 307.6 lb

## 2011-07-01 DIAGNOSIS — R590 Localized enlarged lymph nodes: Secondary | ICD-10-CM

## 2011-07-01 DIAGNOSIS — D869 Sarcoidosis, unspecified: Secondary | ICD-10-CM | POA: Insufficient documentation

## 2011-07-01 DIAGNOSIS — R599 Enlarged lymph nodes, unspecified: Secondary | ICD-10-CM

## 2011-07-01 NOTE — Progress Notes (Signed)
  Subjective:    Patient ID: [name], female    DOB: October 20, 1966, 45 y.o.   MRN: [id number]  HPI The pt is a 45y/o female who I have been asked to see for an abnormal chest ct.  She was recently in the hospital for atypical chest pain, and had a negative cardiac w/u.  Ct chest done which showed no PE, bilat hilar and mediastinal LN, and a few nodular areas/scarring.  Her ACE level was normal, and her echo in June was normal.  The pt has continued to have chest discomfort that she describes "like menthol on her chest" or "weight on her chest".  She has had doe since beginning of year, but also is morbidly obese and deconditioned.  She describes less than one block doe, and will get winded bringing groceries in from the car and with light housework.  She has a dry cough, but has been on ACE since May.  She denies congestion.  Her weight is up 2 pounds over the last one year.   Review of Systems  Constitutional: Negative for fever and unexpected weight change.  HENT: Negative for ear pain, nosebleeds, congestion, sore throat, rhinorrhea, sneezing, trouble swallowing, dental problem, postnasal drip and sinus pressure.   Eyes: Negative for redness and itching.  Respiratory: Positive for cough and shortness of breath. Negative for chest tightness and wheezing.   Cardiovascular: Positive for chest pain and palpitations. Negative for leg swelling.  Gastrointestinal: Negative for nausea and vomiting.  Genitourinary: Negative for dysuria.  Musculoskeletal: Negative for joint swelling.  Skin: Negative for rash.  Neurological: Positive for headaches.  Hematological: Does not bruise/bleed easily.  Psychiatric/Behavioral: Negative for dysphoric mood. The patient is not nervous/anxious.        Objective:   Physical Exam Constitutional:  Well developed, no acute distress  HENT:  Nares patent without discharge, increased turbinates.  Oropharynx without exudate, palate and uvula are elongated  Eyes:   Perrla, eomi, no scleral icterus  Neck:  No JVD, no TMG  Cardiovascular:  Normal rate, regular rhythm, no rubs or gallops.  No murmurs        Intact distal pulses  Pulmonary :  Normal breath sounds, no stridor or respiratory distress   No rales, rhonchi, or wheezing.  Decreased depth of inspiration due to centripetal obesity.   Abdominal:  Soft, nondistended, bowel sounds present.  No tenderness noted.   Musculoskeletal:  No lower extremity edema noted.  Lymph Nodes:  No cervical lymphadenopathy noted  Skin:  No cyanosis noted  Neurologic:  Alert, appropriate, moves all 4 extremities without obvious deficit.         Assessment & Plan:

## 2011-07-01 NOTE — Patient Instructions (Signed)
Will refer to chest surgeon for biopsy of your lymph nodes. Will arrange followup with me once results are available. Please discuss with your primary md changing your lisinopril to another medication in light of your cough.

## 2011-07-01 NOTE — Telephone Encounter (Signed)
Spoke with pt who was just seen by [facility] today. She states that she is needing to know what she take take for her CP in the meantime while waiting for eval by [facility]. Please advise, thanks!

## 2011-07-01 NOTE — Assessment & Plan Note (Signed)
The pt has mediastinal LN on chest ct that I suspect is due to sarcoid, but cannot exclude the possibility of lymphoma.  The pt has other symptoms that could be related to both of these, although may be unrelated.  I have discussed the different approaches to tissue diagnosis, including FOB with TBBX vs EBUS/mediastinoscopy with general anesthesia.  She would prefer to do one procedure and be done with it, vs a staged approach.  I will set up appt with Dr. Edwyna Shell, and we can set up a time to do EBUS followed by mediastinoscopy if nondiagnostic.

## 2011-07-02 NOTE — Telephone Encounter (Signed)
Totally agree with advice to go to ER if having persistent chest pain.

## 2011-07-02 NOTE — Telephone Encounter (Signed)
Spoke with patient this am to inform her of her appt with Dr Edwyna Shell office, she inquired about her CP, and what should she do. Has left several msg, pt having pain mid chest, sx since yesterday  I informed pt if she is having  chest pain or pressure, since yesterday  With no relief I recommend she should go to ED to be assessed. Pt agreed .[name]

## 2011-07-05 ENCOUNTER — Other Ambulatory Visit: Payer: Self-pay

## 2011-07-05 ENCOUNTER — Encounter (INDEPENDENT_AMBULATORY_CARE_PROVIDER_SITE_OTHER): Payer: Managed Care, Other (non HMO) | Admitting: Thoracic Surgery

## 2011-07-05 DIAGNOSIS — R599 Enlarged lymph nodes, unspecified: Secondary | ICD-10-CM

## 2011-07-06 NOTE — Letter (Signed)
July 05, 2011  Barbaraann Share, MD,FCCP [address] [place] [address]  Re:  [name], [name]               DOB:  07-30-1966  Dear Mellody Dance:  I saw the patient in the office today.  This 45 year old African American female was found to have mediastinal adenopathy and was referred here for evaluation.  She also had some hilar adenopathy.  The CT scan showed a mediastinal hilar adenopathy.  She has had some cough and wheezing.  She is referred here for possible biopsies.  MEDICATIONS:  Lisinopril, metoprolol, hydrochlorothiazide and travertine.  She has hypertension and no evidence of allergies.  FAMILY HISTORY:  Noncontributory.  SOCIAL HISTORY:  She has had one child.  She is a Corporate investment banker.  Does not smoke, does not drink alcohol on a regular basis.  REVIEW OF SYSTEMS:  VITAL SIGNS:  She is 5 feet 11 inches, 300 pounds. GENERAL:  Her weight has been stable. CARDIAC:  She has chest pain, palpitation and shortness of breath. PULMONARY:  She has productive cough and wheezing. GI:  No nausea, vomiting, constipation, or diarrhea. GU:  No kidney disease, dysuria, or frequent urination. VASCULAR:  No claudication, DVT, or TIAs. NEUROLOGICAL:  No dizziness and headaches. MUSCULOSKELETAL:  Joint pain, muscular pain and rash. PSYCHIATRIC:  No depression or nervousness. EYES/ENT:  She has had some recent decrease in her eyesight.  No changes in her hearing. HEMATOLOGICAL:  No problems with bleeding, clotting disorders, or anemia.  PHYSICAL EXAMINATION:  Vital Signs:  He is slightly obese African American female, in no acute distress.  Her blood pressure is 148/88, pulse 84, respirations 20 and sats were 98%.  Head, Eyes, Ears, Nose And Throat:  Were unremarkable.  Neck:  Supple without thyromegaly.  There is no supraclavicular or axillary adenopathy.  Chest:  Clear to auscultation and percussion.  Heart:  Regular sinus rhythm.  No murmurs. Abdomen:  Soft.  No hepatosplenomegaly.   Extremities:  Pulses are 2+. There is no clubbing or edema.  Neurological:  He is oriented x3. Sensory and motor intact.  Cranial nerves intact.  Unfortunately, I think this is probably sarcoidosis based on the CT findings.  I have discussed with her to do bronchoscopy with endobronchial ultrasound and possible mediastinoscopy.  We will coordinate this with your schedule.  I appreciate the opportunity of seeing the patient.  Ines Bloomer, M.D. Electronically Signed  DPB/MEDQ  D:  07/05/2011  T:  07/06/2011  Job:  [id number]

## 2011-07-12 ENCOUNTER — Telehealth: Payer: Self-pay | Admitting: Cardiology

## 2011-07-12 ENCOUNTER — Encounter (HOSPITAL_COMMUNITY)
Admission: RE | Admit: 2011-07-12 | Discharge: 2011-07-12 | Disposition: A | Discharge status: Home / Self Care | Payer: Managed Care, Other (non HMO) | Source: Ambulatory Visit | Attending: Thoracic Surgery | Admitting: Thoracic Surgery

## 2011-07-12 ENCOUNTER — Other Ambulatory Visit: Payer: Self-pay | Admitting: Thoracic Surgery

## 2011-07-12 ENCOUNTER — Ambulatory Visit (HOSPITAL_COMMUNITY)
Admission: RE | Admit: 2011-07-12 | Discharge: 2011-07-12 | Disposition: A | Discharge status: Home / Self Care | Payer: Managed Care, Other (non HMO) | Source: Ambulatory Visit | Attending: Thoracic Surgery | Admitting: Thoracic Surgery

## 2011-07-12 ENCOUNTER — Telehealth: Payer: Self-pay | Admitting: *Deleted

## 2011-07-12 DIAGNOSIS — R599 Enlarged lymph nodes, unspecified: Secondary | ICD-10-CM | POA: Insufficient documentation

## 2011-07-12 DIAGNOSIS — Z01812 Encounter for preprocedural laboratory examination: Secondary | ICD-10-CM | POA: Insufficient documentation

## 2011-07-12 DIAGNOSIS — Z01818 Encounter for other preprocedural examination: Secondary | ICD-10-CM | POA: Insufficient documentation

## 2011-07-12 DIAGNOSIS — R590 Localized enlarged lymph nodes: Secondary | ICD-10-CM

## 2011-07-12 LAB — COMPREHENSIVE METABOLIC PANEL
BUN: 16 mg/dL (ref 6–23)
CO2: 27 mEq/L (ref 19–32)
Chloride: 105 mEq/L (ref 96–112)
Creatinine, Ser: 0.89 mg/dL (ref 0.50–1.10)
GFR calc non Af Amer: >60 mL/min (ref >60)
Glucose, Bld: 110 mg/dL — ABNORMAL HIGH (ref 70–99)
Total Bilirubin: 0.2 mg/dL — ABNORMAL LOW (ref 0.3–1.2)

## 2011-07-12 LAB — PROTIME-INR: Prothrombin Time: 14 seconds (ref 11.6–15.2)

## 2011-07-12 LAB — CBC
Hemoglobin: 13.7 g/dL (ref 12.0–15.0)
MCH: 28.7 pg (ref 26.0–34.0)
MCHC: 34.2 g/dL (ref 30.0–36.0)
MCV: 83.9 fL (ref 78.0–100.0)

## 2011-07-12 LAB — TYPE AND SCREEN
ABO/RH(D): O POS
Antibody Screen: NEGATIVE

## 2011-07-12 NOTE — Telephone Encounter (Signed)
No note required for this encounter. It was used as a Special educational needs teacher.

## 2011-07-12 NOTE — Telephone Encounter (Signed)
Phone#: [phone] Latest OV

## 2011-07-13 ENCOUNTER — Other Ambulatory Visit: Payer: Self-pay | Admitting: Pulmonary Disease

## 2011-07-13 ENCOUNTER — Ambulatory Visit (HOSPITAL_COMMUNITY)
Admission: RE | Admit: 2011-07-13 | Discharge: 2011-07-13 | Disposition: A | Discharge status: Home / Self Care | Payer: Managed Care, Other (non HMO) | Source: Ambulatory Visit | Attending: Thoracic Surgery | Admitting: Thoracic Surgery

## 2011-07-13 ENCOUNTER — Ambulatory Visit: Payer: Managed Care, Other (non HMO) | Admitting: Cardiology

## 2011-07-13 DIAGNOSIS — I10 Essential (primary) hypertension: Secondary | ICD-10-CM | POA: Insufficient documentation

## 2011-07-13 DIAGNOSIS — Z01812 Encounter for preprocedural laboratory examination: Secondary | ICD-10-CM | POA: Insufficient documentation

## 2011-07-13 DIAGNOSIS — R0602 Shortness of breath: Secondary | ICD-10-CM | POA: Insufficient documentation

## 2011-07-13 DIAGNOSIS — E669 Obesity, unspecified: Secondary | ICD-10-CM | POA: Insufficient documentation

## 2011-07-13 DIAGNOSIS — Z01810 Encounter for preprocedural cardiovascular examination: Secondary | ICD-10-CM | POA: Insufficient documentation

## 2011-07-13 DIAGNOSIS — R599 Enlarged lymph nodes, unspecified: Secondary | ICD-10-CM | POA: Insufficient documentation

## 2011-07-13 DIAGNOSIS — Z01818 Encounter for other preprocedural examination: Secondary | ICD-10-CM | POA: Insufficient documentation

## 2011-07-13 NOTE — Op Note (Signed)
  NAMEYAEKO, FAZEKAS               ACCOUNT NO.:  [id number]  MEDICAL RECORD NO.:  [id number]  LOCATION:  SDSC                         FACILITY:  [facility]  PHYSICIAN:  Barbaraann Share, MD,FCCPDATE OF BIRTH:  1966-09-[birth year]  DATE OF PROCEDURE:  07/13/2011 DATE OF DISCHARGE:  07/12/2011                              OPERATIVE REPORT   PROCEDURE:  Video bronchoscopy with endobronchial ultrasound.  OPERATOR:  Barbaraann Share, MD, FCCP  ASSISTANT:  Ines Bloomer, MD  ANESTHESIA:  General with endotracheal intubation.  DESCRIPTION:  After obtaining informed consent and under general anesthesia, a fiberoptic scope was passed through the endotracheal tube and down to the level of the carina.  The airways were examined serially at the subsegmental level with no obvious endobronchial abnormality being noted.  The endobronchial ultrasound scope was then passed through the endotracheal tube and the various lymph node stages were imaged with obvious lymphadenopathy at #7 and also are 10.  Multiple transbronchial needle aspirations were done from the subcarinal and R10 stages with good specimens being obtained.  The scope was removed and the specimens were sent for quick staining, with noncaseating granulomas being noted. Because of the above findings, the decision was made not to proceed with mediastinoscopy since granulomas were found.  Overall, the patient tolerated the procedure well and was transferred to PACU in stable condition.  We will await final pathology before making treatment decisions.     Barbaraann Share, MD,FCCP     [facility]/MEDQ  D:  07/13/2011  T:  07/13/2011  Job:  [id number]  cc:   Ines Bloomer, M.D.  Electronically Signed by Marcelyn Bruins MDFCCP on 07/13/2011 01:42:57 PM

## 2011-07-14 NOTE — Telephone Encounter (Signed)
Verified with Caryn Bee, everything faxed

## 2011-07-15 ENCOUNTER — Telehealth: Payer: Self-pay | Admitting: Pulmonary Disease

## 2011-07-15 NOTE — Telephone Encounter (Signed)
Already spoke with pt.  Pt scheduled to see [facility] tomorrow at 4:30 pm.

## 2011-07-16 ENCOUNTER — Encounter: Payer: Self-pay | Admitting: Pulmonary Disease

## 2011-07-16 ENCOUNTER — Ambulatory Visit (INDEPENDENT_AMBULATORY_CARE_PROVIDER_SITE_OTHER): Payer: Managed Care, Other (non HMO) | Admitting: Pulmonary Disease

## 2011-07-16 VITALS — BP 136/82 | HR 80 | Temp 98.0°F | Ht 71.0 in | Wt 310.0 lb

## 2011-07-16 DIAGNOSIS — D869 Sarcoidosis, unspecified: Secondary | ICD-10-CM

## 2011-07-16 MED ORDER — PREDNISONE 20 MG PO TABS: ORAL_TABLET | ORAL | Status: DC

## 2011-07-16 NOTE — Assessment & Plan Note (Signed)
The pt has non-caseating granulomas on her TTNA via EBUS, most c/w a diagnosis of sarcoid.  However, she understands that it is unclear how much of her current symptoms are really due to sarcoid.  Will start on course of prednisone at 40mg /day for next 4 weeks and see how she responds.  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  Unclear if cardiac sarcoid could be responsible for her conduction system issues, and will leave to Dr. Elvis Coil expertise.

## 2011-07-16 NOTE — Patient Instructions (Signed)
Will start on prednisone 40mg  each day on full stomach Will schedule for breathing studies one morning over the next few weeks followup with me in 3 weeks. You need to see eye doctor to make sure no eye involvement with sarcoid.  Your primary md can refer to someone they use on a regular basis.

## 2011-07-16 NOTE — Progress Notes (Signed)
  Subjective:    Patient ID: [name], female    DOB: 02-Sep-1966, 45 y.o.   MRN: [id number]  HPI The pt comes in today for f/u of her EBUS results.  Her TTNA revealed non-caseating granulomas, and her history is most suggestive of sarcoidosis.  I have reviewed the results with her and her sister, and also discussed at length the pathophysiology of sarcoid.     Review of Systems  Constitutional: Negative for fever and unexpected weight change.  HENT: Negative for ear pain, nosebleeds, congestion, sore throat, rhinorrhea, sneezing, trouble swallowing, dental problem, postnasal drip and sinus pressure.   Eyes: Negative for redness and itching.  Respiratory: Positive for cough and shortness of breath. Negative for chest tightness and wheezing.   Cardiovascular: Negative for palpitations and leg swelling.  Gastrointestinal: Negative for nausea and vomiting.  Genitourinary: Negative for dysuria.  Musculoskeletal: Negative for joint swelling.  Skin: Negative for rash.  Neurological: Negative for headaches.  Hematological: Does not bruise/bleed easily.  Psychiatric/Behavioral: Negative for dysphoric mood. The patient is not nervous/anxious.        Objective:   Physical Exam Obese female in nad No purulence or discharge from nares Chest with totally clear lung fields. Cor with rrr LE with minimal edema, no cyanosis Alert and oriented, moves all 4        Assessment & Plan:

## 2011-07-19 ENCOUNTER — Ambulatory Visit (INDEPENDENT_AMBULATORY_CARE_PROVIDER_SITE_OTHER): Payer: Managed Care, Other (non HMO) | Admitting: Cardiology

## 2011-07-19 ENCOUNTER — Encounter: Payer: Self-pay | Admitting: Cardiology

## 2011-07-19 DIAGNOSIS — I493 Ventricular premature depolarization: Secondary | ICD-10-CM

## 2011-07-19 DIAGNOSIS — I4949 Other premature depolarization: Secondary | ICD-10-CM

## 2011-07-19 DIAGNOSIS — R079 Chest pain, unspecified: Secondary | ICD-10-CM | POA: Insufficient documentation

## 2011-07-19 DIAGNOSIS — I452 Bifascicular block: Secondary | ICD-10-CM

## 2011-07-19 NOTE — Assessment & Plan Note (Signed)
Her chest pain symptoms are atypical. I recommended a nuclear stress test to rule out ischemic heart disease to complete her cardiac workup. It is possible her pain could be related to her sarcoidosis and we will need to monitor her response to steroids.

## 2011-07-19 NOTE — Progress Notes (Signed)
   [name] Date of Birth: May 14, 1966   History of Present Illness: [name] is seen today for followup. Her prior echocardiogram was unremarkable. Her Holter monitor showed frequent PVCs. She had a CT of the chest which was negative for pulmonary emboli but did show hilar adenopathy. She subsequently underwent bronchoscopy which was consistent with sarcoidosis. She has been placed on prednisone by Dr. Shelle Iron. She continues to have a mild cough. He continues to complain of atypical chest pain. She has had no significant dizziness or syncope but does complain of a lack of energy.  Current Outpatient Prescriptions on File Prior to Visit  Medication Sig Dispense Refill  . hydrochlorothiazide 25 MG tablet Take 1 tablet by mouth daily.      . metoprolol tartrate (LOPRESSOR) 25 MG tablet Take 12.5 mg by mouth 2 (two) times daily.        . predniSONE (DELTASONE) 20 MG tablet Take 2 tablets by mouth daily  60 tablet  1    No Known Allergies  Past Medical History  Diagnosis Date  . Hypertension   . Ovarian cyst   . Fibroid tumor   . PVC (premature ventricular contraction)   . Bifascicular block   . Sarcoidosis     Past Surgical History  Procedure Date  . Ovarian cyst removal   . Tonsillectomy   . Appendectomy 1988  . Partial hysterectomy 1988    right  . Tubal ligation 1991  . Bladder surgery before age 16    bladder/kidney surgery to reconnect tubes.    History  Smoking status  . Never Smoker   Smokeless tobacco  . Never Used    History  Alcohol Use No    Family History  Problem Relation Age of Onset  . Anemia Sister   . Anemia Sister   . Diabetes Mother   . Heart disease Mother   . Hypertension Mother   . Diabetes Father   . Hypertension Father   . Scleroderma Father     Review of Systems: As noted in history of present illness.All other systems were reviewed and are negative.  Physical Exam: BP 134/80  Pulse 64  Ht 5\' 11"  (1.803 m)  Wt 303 lb  (137.44 kg)  BMI 42.26 kg/m2 She is a pleasant overweight black female in no acute distress. Normocephalic, atraumatic. Pupils are equal round and reactive to light and accommodation. Extraocular movements are clear. Sclera are clear. Oropharynx is clear with good dentition. Neck is supple without JVD, adenopathy, thyromegaly, or bruits. Lungs are clear. Cardiac exam reveals a regular rate and rhythm with frequent extrasystoles. She has no gallop, murmur, or click. Abdomen is obese, soft, nontender. She has no hepatosplenomegaly masses or bruits. Extremities are without edema. Pulses are 2+ and symmetric. Skin is warm and dry. She is alert and oriented x3. Cranial nerves II through XII are intact. 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 She has frequent PVCs.  Assessment / Plan:

## 2011-07-19 NOTE — Assessment & Plan Note (Signed)
She really has had no clinical response to beta blocker therapy. Her recent ECG still showed frequent PVCs. If her stress test is negative I would recommend stopping her beta blocker therapy since she has not responded clinically. There is concern that beta blockers may exacerbate her bifascicular block.

## 2011-07-19 NOTE — Assessment & Plan Note (Signed)
Her conduction system disease may be related to her sarcoidosis and we will need to monitor for any symptoms of AV block. Her Holter monitor did not show any significant evidence of heart block.

## 2011-07-19 NOTE — Patient Instructions (Signed)
We will schedule you for a nuclear stress test  Continue your current therapy   

## 2011-07-23 ENCOUNTER — Ambulatory Visit (INDEPENDENT_AMBULATORY_CARE_PROVIDER_SITE_OTHER): Payer: Managed Care, Other (non HMO) | Admitting: Pulmonary Disease

## 2011-07-23 ENCOUNTER — Telehealth: Payer: Self-pay | Admitting: Pulmonary Disease

## 2011-07-23 ENCOUNTER — Other Ambulatory Visit: Payer: Self-pay | Admitting: *Deleted

## 2011-07-23 DIAGNOSIS — D869 Sarcoidosis, unspecified: Secondary | ICD-10-CM

## 2011-07-23 LAB — PULMONARY FUNCTION TEST

## 2011-07-23 NOTE — Telephone Encounter (Signed)
Please let pt know that her baseline lung function looks good. No change in treatment plans.

## 2011-07-23 NOTE — Progress Notes (Signed)
PFT done today. 

## 2011-07-27 NOTE — Telephone Encounter (Signed)
ATC pt but mailbox is full and unable to accept new messages.  WCB.

## 2011-07-28 ENCOUNTER — Encounter (HOSPITAL_COMMUNITY): Payer: Managed Care, Other (non HMO) | Admitting: Radiology

## 2011-07-28 ENCOUNTER — Encounter: Payer: Self-pay | Admitting: Pulmonary Disease

## 2011-07-29 ENCOUNTER — Other Ambulatory Visit (HOSPITAL_COMMUNITY): Payer: Managed Care, Other (non HMO) | Admitting: Radiology

## 2011-07-29 ENCOUNTER — Other Ambulatory Visit (HOSPITAL_COMMUNITY): Payer: Managed Care, Other (non HMO)

## 2011-07-29 ENCOUNTER — Ambulatory Visit (HOSPITAL_COMMUNITY): Payer: Managed Care, Other (non HMO) | Attending: Cardiology

## 2011-07-29 DIAGNOSIS — I10 Essential (primary) hypertension: Secondary | ICD-10-CM | POA: Insufficient documentation

## 2011-07-29 DIAGNOSIS — I4949 Other premature depolarization: Secondary | ICD-10-CM | POA: Insufficient documentation

## 2011-07-29 DIAGNOSIS — D869 Sarcoidosis, unspecified: Secondary | ICD-10-CM | POA: Insufficient documentation

## 2011-07-29 DIAGNOSIS — R072 Precordial pain: Secondary | ICD-10-CM | POA: Insufficient documentation

## 2011-07-29 MED ORDER — ATROPINE SULFATE 0.1 MG/ML IJ SOLN
0.2500 mg | Freq: Once | INTRAMUSCULAR | Status: AC
  Administered 2011-07-29: 0.25 mg via INTRAVENOUS

## 2011-07-29 MED ORDER — DOBUTAMINE HCL 250 MG/20ML IV SOLN
40.0000 ug/kg | Freq: Once | INTRAVENOUS | Status: AC
  Administered 2011-07-29: 40 ug/kg/min via INTRAVENOUS

## 2011-07-29 MED ORDER — PERFLUTREN PROTEIN A MICROSPH IV SUSP
2.5000 mL | Freq: Once | INTRAVENOUS | Status: AC
  Administered 2011-07-29: 2.5 mL via INTRAVENOUS

## 2011-07-29 NOTE — Telephone Encounter (Signed)
ATC pt but mailbox is still full and unable to accept new messages.  WCB

## 2011-08-02 ENCOUNTER — Telehealth: Payer: Self-pay | Admitting: *Deleted

## 2011-08-02 ENCOUNTER — Encounter: Payer: Self-pay | Admitting: *Deleted

## 2011-08-02 NOTE — Telephone Encounter (Signed)
Advised re- echo results

## 2011-08-02 NOTE — Telephone Encounter (Signed)
Message copied by Eugenia Pancoast on Mon Aug 02, 2011  2:57 PM ------      Message from: [place], PETER M      Created: Sun Aug 01, 2011 10:58 AM       Normal Dobutamine Echo. No evidence of CAD. Please report.

## 2011-08-02 NOTE — Telephone Encounter (Signed)
ATC pt but mailbox is still full and cannot accept new message. I have attempted to contact this pt numerous times.  Will send her a letter to call our office to discuss test results.

## 2011-08-05 ENCOUNTER — Telehealth: Payer: Self-pay | Admitting: Pulmonary Disease

## 2011-08-05 NOTE — Telephone Encounter (Signed)
ATC home number and NA and voicemail full. [id number] on other number given. Looks like pt wants PFT results. Results are note in phone note from 07-23-11, see phone note. Carron Curie, CMA

## 2011-08-05 NOTE — Telephone Encounter (Signed)
Pt informed of KC's recs of PFT. Pt verbalized understanding

## 2011-08-06 ENCOUNTER — Ambulatory Visit (INDEPENDENT_AMBULATORY_CARE_PROVIDER_SITE_OTHER): Payer: Managed Care, Other (non HMO) | Admitting: Pulmonary Disease

## 2011-08-06 ENCOUNTER — Encounter: Payer: Self-pay | Admitting: Pulmonary Disease

## 2011-08-06 VITALS — BP 120/88 | HR 93 | Temp 98.6°F | Ht 71.0 in | Wt 308.2 lb

## 2011-08-06 DIAGNOSIS — D869 Sarcoidosis, unspecified: Secondary | ICD-10-CM

## 2011-08-06 NOTE — Patient Instructions (Signed)
Would decrease prednisone to 30mg  a day for 14 days, then 20mg  a day for 14 days, then 10mg  a day until next visit with me Trial of nexium 40mg  one each am before eating for next 2 weeks to see if helps cough Chlorpheniramine 8mg  one each night at bedtime for next 2 weeks to see if helps cough followup with me in 8weeks.

## 2011-08-06 NOTE — Assessment & Plan Note (Signed)
The patient has done very well from a symptom standpoint since being on prednisone.  Her shortness of breath has almost resolved, and her chest discomfort has totally resolved.  At this point, I would like to begin tapering her prednisone over the next 4-6 weeks.  We'll get her down to a dose of 10 mg a day, and see her back to discuss her duration of therapy.  She is to call me if her symptoms begin to recur at lower doses.  I have also encouraged her to work aggressively on weight loss.

## 2011-08-06 NOTE — Progress Notes (Signed)
  Subjective:    Patient ID: [name], female    DOB: [birth year], 45 y.o.   MRN: [id number]  HPI The patient comes in today for followup of her known sarcoidosis.  She is being treated with a prednisone course, and feels that her dyspnea on exertion is at least 80-90% improved since last visit.  Her chest discomfort has also resolved.  She is having no significant complications from the prednisone at this time.  She is continuing to have a dry cough which sounds more upper airway than lower.   Review of Systems  Constitutional: Negative for fever and unexpected weight change.  HENT: Negative for ear pain, nosebleeds, congestion, sore throat, rhinorrhea, sneezing, trouble swallowing, dental problem, postnasal drip and sinus pressure.   Eyes: Negative for redness and itching.  Respiratory: Positive for cough and wheezing. Negative for chest tightness and shortness of breath.   Cardiovascular: Negative for palpitations and leg swelling.  Gastrointestinal: Negative for nausea and vomiting.  Genitourinary: Negative for dysuria.  Musculoskeletal: Negative for joint swelling.  Skin: Negative for rash.  Neurological: Negative for headaches.  Hematological: Does not bruise/bleed easily.  Psychiatric/Behavioral: Negative for dysphoric mood. The patient is not nervous/anxious.        Objective:   Physical Exam Overweight female in no acute distress Nose without purulence or discharge noted Chest totally clear to auscultation Cardiac exam with regular rate and rhythm Lower extremities without edema, no cyanosis noted Alert and oriented, moves all 4 extremities.       Assessment & Plan:

## 2011-10-01 ENCOUNTER — Ambulatory Visit: Payer: Managed Care, Other (non HMO) | Admitting: Pulmonary Disease

## 2011-10-11 ENCOUNTER — Telehealth: Payer: Self-pay | Admitting: Pulmonary Disease

## 2011-10-11 NOTE — Telephone Encounter (Signed)
I spoke with the pt and she states she is having increased cough over last few weeks. She also states she has been having increased muscle aches, and joint pain which are symptoms she had when she was first diagnosed with sarcoidosis. She states she has cut down her prednisone to 10mg  daily as instructed and thinks maybe this is why symptoms have returned. Pt set to see [facility] tomorrow at 9:45am. Carron Curie, CMA

## 2011-10-12 ENCOUNTER — Ambulatory Visit (INDEPENDENT_AMBULATORY_CARE_PROVIDER_SITE_OTHER): Payer: Managed Care, Other (non HMO) | Admitting: Internal Medicine

## 2011-10-12 ENCOUNTER — Ambulatory Visit: Payer: Managed Care, Other (non HMO) | Admitting: Pulmonary Disease

## 2011-10-12 ENCOUNTER — Other Ambulatory Visit (INDEPENDENT_AMBULATORY_CARE_PROVIDER_SITE_OTHER): Payer: Managed Care, Other (non HMO)

## 2011-10-12 ENCOUNTER — Encounter: Payer: Self-pay | Admitting: Internal Medicine

## 2011-10-12 ENCOUNTER — Ambulatory Visit (INDEPENDENT_AMBULATORY_CARE_PROVIDER_SITE_OTHER)
Admission: RE | Admit: 2011-10-12 | Discharge: 2011-10-12 | Disposition: A | Discharge status: Home / Self Care | Payer: Managed Care, Other (non HMO) | Source: Ambulatory Visit | Attending: Internal Medicine | Admitting: Internal Medicine

## 2011-10-12 VITALS — BP 132/90 | HR 80 | Temp 97.2°F | Ht 71.0 in | Wt 306.0 lb

## 2011-10-12 DIAGNOSIS — D869 Sarcoidosis, unspecified: Secondary | ICD-10-CM

## 2011-10-12 LAB — HEPATIC FUNCTION PANEL
AST: 18 U/L (ref 0–37)
Albumin: 4.2 g/dL (ref 3.5–5.2)
Alkaline Phosphatase: 69 U/L (ref 39–117)
Bilirubin, Direct: 0.1 mg/dL (ref 0.0–0.3)
Total Bilirubin: 0.4 mg/dL (ref 0.3–1.2)

## 2011-10-12 LAB — BASIC METABOLIC PANEL
BUN: 24 mg/dL — ABNORMAL HIGH (ref 6–23)
Chloride: 104 mEq/L (ref 96–112)
GFR: 109.75 mL/min (ref >60.00)
Potassium: 4.1 mEq/L (ref 3.5–5.1)
Sodium: 142 mEq/L (ref 135–145)

## 2011-10-12 LAB — CBC WITH DIFFERENTIAL/PLATELET
Basophils Relative: 0.9 % (ref 0.0–3.0)
Eosinophils Absolute: 0.5 10*3/uL (ref 0.0–0.7)
Lymphocytes Relative: 19.8 % (ref 12.0–46.0)
MCHC: 33 g/dL (ref 30.0–36.0)
Neutrophils Relative %: 66.4 % (ref 43.0–77.0)
Platelets: 299 10*3/uL (ref 150.0–400.0)
RBC: 5.03 Mil/uL (ref 3.87–5.11)
WBC: 8.4 10*3/uL (ref 4.5–10.5)

## 2011-10-12 LAB — SEDIMENTATION RATE: Sed Rate: 19 mm/hr (ref 0–22)

## 2011-10-12 NOTE — Progress Notes (Signed)
  Subjective:    Patient ID: [name], female    DOB: October 16, 1966, 45 y.o.   MRN: [id number]  HPI 08/06/11 ov/ Clance The patient comes in today for followup of her known sarcoidosis.  She is being treated with a prednisone course, and feels that her dyspnea on exertion is at least 80-90% improved since last visit.  Her chest discomfort has also resolved.  She is having no significant complications from the prednisone at this time.  She is continuing to have a dry cough which sounds more upper airway than lower. Would decrease prednisone to 30mg  a day for 14 days, then 20mg  a day for 14 days, then 10mg  a day until next visit with me Trial of nexium 40mg  one each am before eating for next 2 weeks to see if helps cough Chlorpheniramine 8mg  one each night at bedtime for next 2 weeks to see if helps cough   10/12/2011  [name] reports "much better " but never really resolved the cough even off acei and on high doses of pred which she tapered to 20 mg per day and held there since last ov and now  cc weak and joint pains  In ankles, wrists and elbows, has not tried nsaids, not using ppi's.  No excess mucus, no sob.    Sleeping ok without nocturnal  or early am exacerbation  of respiratory  c/o's or need for noct saba. Also denies any obvious fluctuation of symptoms with weather or environmental changes or other aggravating or alleviating factors except as outlined above   ROS:  At present neg for  any significant sore throat, dysphagia, itching, sneezing,  nasal congestion or excess/ purulent secretions,  fever, chills, sweats, unintended wt loss, pleuritic or exertional cp, hempoptysis, orthopnea pnd or leg swelling.           Objective:   Physical Exam Overweight female in no acute distress Nose without purulence or discharge noted Chest totally clear to auscultation Cardiac exam with regular rate and rhythm Lower extremities without edema, no cyanosis noted Alert and oriented, moves all 4  extremities.  CXR  10/12/2011 : Stable right hilar fullness/adenopathy. No acute process.       Assessment & Plan:

## 2011-10-12 NOTE — Assessment & Plan Note (Addendum)
There is no evidence of active sarcoidosis and at this point the prednisone responsive component of her problems appera to have been adequately addressed and ? Whether some of her symptoms aren't related to prednsione rx rather than sarcoid.  The goal with a chronic steroid dependent illness is always arriving at the lowest effective dose that controls the disease/symptoms and not accepting a set "formula" which is based on statistics or guidelines that don't always take into account patient  variability or the natural hx of the dz in every individual patient, which may well vary over time.  For now therefore I recommend the patient maintain  A floor of prednisone of 10 mg per day which was Dr Teddy Spike previous intent ) and work on symptom control with trial of aleve and nsaids  See instructions for specific recommendations which were reviewed directly with the patient who was given a copy with highlighter outlining the key components.

## 2011-10-12 NOTE — Patient Instructions (Signed)
Prednisone 20 mg one half daily  Please remember to go to the lab and x-ray department downstairs for your tests - we will call you with the results when then are available.  Try prilosec 20mg   Take 30-60 min before first meal of the day and Pepcid 20 mg one bedtime until cough is completely gone for at least a week without the need for cough suppression  GERD (REFLUX)  is an extremely common cause of respiratory symptoms, many times with no significant heartburn at all.    It can be treated with medication, but also with lifestyle changes including avoidance of late meals, excessive alcohol, smoking cessation, and avoid fatty foods, chocolate, peppermint, colas, red wine, and acidic juices such as orange juice.  NO MINT OR MENTHOL PRODUCTS SO NO COUGH DROPS  USE SUGARLESS CANDY INSTEAD (jolley ranchers or Stover's)  NO OIL BASED VITAMINS - use powdered substitutes.   Try aleve with meals as needed   Please schedule a follow up office visit in 2  weeks, sooner if needed with Dr Shelle Iron

## 2011-10-13 NOTE — Progress Notes (Signed)
Quick Note:  Spoke with pt and notified of results per Dr. Wert. Pt verbalized understanding and denied any questions.  ______ 

## 2011-10-19 ENCOUNTER — Ambulatory Visit: Payer: Managed Care, Other (non HMO) | Admitting: Cardiology

## 2011-11-03 ENCOUNTER — Ambulatory Visit (INDEPENDENT_AMBULATORY_CARE_PROVIDER_SITE_OTHER): Payer: Managed Care, Other (non HMO) | Admitting: Pulmonary Disease

## 2011-11-03 ENCOUNTER — Encounter: Payer: Self-pay | Admitting: Pulmonary Disease

## 2011-11-03 VITALS — BP 142/96 | HR 85 | Temp 98.3°F | Ht 71.0 in | Wt 308.0 lb

## 2011-11-03 DIAGNOSIS — D869 Sarcoidosis, unspecified: Secondary | ICD-10-CM

## 2011-11-03 NOTE — Progress Notes (Signed)
  Subjective:    Patient ID: [name], female    DOB: 19-Jan-1966, 45 y.o.   MRN: [id number]  HPI The patient comes in today for followup of her known sarcoidosis.  She has been managed on low-dose prednisone, and overall is doing very well currently.  She did have issues with cough that was felt to be upper airway in origin and related to reflux disease, and this has improved with treatment.  She currently is not on any reflux medication.  She denies any worsening of her breathing, and as stated above her cough is now resolved.  She is having some joint issues, but feels it is tolerable at the current level.   Review of Systems  Constitutional: Negative for fever and unexpected weight change.  HENT: Negative for ear pain, nosebleeds, congestion, sore throat, rhinorrhea, sneezing, trouble swallowing, dental problem, postnasal drip and sinus pressure.   Eyes: Negative for redness and itching.  Respiratory: Negative for cough, chest tightness, shortness of breath and wheezing.   Cardiovascular: Negative for palpitations and leg swelling.  Gastrointestinal: Negative for nausea and vomiting.  Genitourinary: Negative for dysuria.  Musculoskeletal: Positive for joint swelling.  Skin: Negative for rash.  Neurological: Negative for headaches.  Hematological: Does not bruise/bleed easily.  Psychiatric/Behavioral: Negative for dysphoric mood. The patient is not nervous/anxious.        Objective:   Physical Exam Obese female in no acute distress Nose without purulence or discharge noted Chest totally clear to auscultation, no wheezes Cardiac exam with regular rate and rhythm Lower extremities without significant edema, no cyanosis noted Alert and oriented, moves all 4 extremities.       Assessment & Plan:

## 2011-11-03 NOTE — Patient Instructions (Signed)
Decrease prednisone to 5mg  a day on even days, and 10mg  on odd days.  Do this for 2-3 weeks, then decrease to 5mg  each day until next visit with me.   Please call if having issues. If cough returns, the first thing to do is get back on acid reflux medication to see if resolves again. If your joint complaints become more of an issue for you, can refer you to a rheumatologist for evaluation Work on weight loss and conditioning program followup with me the end of Jan sometime.

## 2011-11-03 NOTE — Assessment & Plan Note (Signed)
The patient overall is doing fairly well on her low dose prednisone.  I would like to move toward tapering this slowly, and I have written out a schedule for her to do this.  She is really not having a lot of pulmonary issues at this time, and does not feel her joint issues are significant enough to pulse her prednisone.

## 2011-12-15 ENCOUNTER — Emergency Department (HOSPITAL_COMMUNITY)
Admission: EM | Admit: 2011-12-15 | Discharge: 2011-12-15 | Disposition: A | Discharge status: Nursing Home (Includes ICF) | Payer: Managed Care, Other (non HMO) | Source: Home / Self Care | Attending: Family Medicine | Admitting: Family Medicine

## 2011-12-15 ENCOUNTER — Emergency Department (HOSPITAL_COMMUNITY)
Admission: EM | Admit: 2011-12-15 | Discharge: 2011-12-15 | Disposition: A | Discharge status: Home / Self Care | Payer: Managed Care, Other (non HMO) | Attending: Emergency Medicine | Admitting: Emergency Medicine

## 2011-12-15 ENCOUNTER — Encounter (HOSPITAL_COMMUNITY): Payer: Self-pay | Admitting: *Deleted

## 2011-12-15 ENCOUNTER — Emergency Department (HOSPITAL_COMMUNITY): Payer: Managed Care, Other (non HMO)

## 2011-12-15 ENCOUNTER — Encounter (HOSPITAL_COMMUNITY): Payer: Self-pay | Admitting: Emergency Medicine

## 2011-12-15 DIAGNOSIS — Z79899 Other long term (current) drug therapy: Secondary | ICD-10-CM | POA: Insufficient documentation

## 2011-12-15 DIAGNOSIS — R110 Nausea: Secondary | ICD-10-CM | POA: Insufficient documentation

## 2011-12-15 DIAGNOSIS — N949 Unspecified condition associated with female genital organs and menstrual cycle: Secondary | ICD-10-CM | POA: Insufficient documentation

## 2011-12-15 DIAGNOSIS — Z9889 Other specified postprocedural states: Secondary | ICD-10-CM | POA: Insufficient documentation

## 2011-12-15 DIAGNOSIS — I452 Bifascicular block: Secondary | ICD-10-CM

## 2011-12-15 DIAGNOSIS — R109 Unspecified abdominal pain: Secondary | ICD-10-CM | POA: Insufficient documentation

## 2011-12-15 DIAGNOSIS — D259 Leiomyoma of uterus, unspecified: Secondary | ICD-10-CM | POA: Insufficient documentation

## 2011-12-15 DIAGNOSIS — R10814 Left lower quadrant abdominal tenderness: Secondary | ICD-10-CM | POA: Insufficient documentation

## 2011-12-15 DIAGNOSIS — I10 Essential (primary) hypertension: Secondary | ICD-10-CM | POA: Insufficient documentation

## 2011-12-15 DIAGNOSIS — D869 Sarcoidosis, unspecified: Secondary | ICD-10-CM | POA: Insufficient documentation

## 2011-12-15 DIAGNOSIS — R102 Pelvic and perineal pain: Secondary | ICD-10-CM

## 2011-12-15 LAB — POCT URINALYSIS DIP (DEVICE)
Ketones, ur: NEGATIVE mg/dL
Protein, ur: 100 mg/dL — AB
Specific Gravity, Urine: >=1.03 (ref 1.005–1.030)
Urobilinogen, UA: 1 mg/dL (ref 0.0–1.0)
pH: 5.5 (ref 5.0–8.0)

## 2011-12-15 LAB — CBC
HCT: 41.4 % (ref 36.0–46.0)
MCH: 28.7 pg (ref 26.0–34.0)
MCV: 84.8 fL (ref 78.0–100.0)
Platelets: 273 10*3/uL (ref 150–400)
RDW: 13.2 % (ref 11.5–15.5)

## 2011-12-15 LAB — BASIC METABOLIC PANEL
CO2: 26 mEq/L (ref 19–32)
Calcium: 10.2 mg/dL (ref 8.4–10.5)
Creatinine, Ser: 0.92 mg/dL (ref 0.50–1.10)
GFR calc non Af Amer: 73 mL/min — ABNORMAL LOW (ref >90)
Glucose, Bld: 107 mg/dL — ABNORMAL HIGH (ref 70–99)
Sodium: 138 mEq/L (ref 135–145)

## 2011-12-15 LAB — URINE MICROSCOPIC-ADD ON

## 2011-12-15 LAB — URINALYSIS, ROUTINE W REFLEX MICROSCOPIC
Glucose, UA: NEGATIVE mg/dL
Hgb urine dipstick: NEGATIVE
Ketones, ur: NEGATIVE mg/dL
Protein, ur: 100 mg/dL — AB
Urobilinogen, UA: 1 mg/dL (ref 0.0–1.0)

## 2011-12-15 LAB — DIFFERENTIAL
Basophils Absolute: 0.1 10*3/uL (ref 0.0–0.1)
Eosinophils Absolute: 0.2 10*3/uL (ref 0.0–0.7)
Eosinophils Relative: 2 % (ref 0–5)
Monocytes Absolute: 1.1 10*3/uL — ABNORMAL HIGH (ref 0.1–1.0)

## 2011-12-15 LAB — WET PREP, GENITAL
Clue Cells Wet Prep HPF POC: NONE SEEN
Trich, Wet Prep: NONE SEEN
Yeast Wet Prep HPF POC: NONE SEEN

## 2011-12-15 MED ORDER — HYDROCODONE-ACETAMINOPHEN 5-325 MG PO TABS
ORAL_TABLET | ORAL | Status: AC
  Filled 2011-12-15: qty 1

## 2011-12-15 MED ORDER — ONDANSETRON HCL 4 MG/2ML IJ SOLN
4.0000 mg | Freq: Once | INTRAMUSCULAR | Status: AC
  Administered 2011-12-15: 4 mg via INTRAVENOUS
  Filled 2011-12-15: qty 2

## 2011-12-15 MED ORDER — HYDROCODONE-ACETAMINOPHEN 5-325 MG PO TABS
1.0000 | ORAL_TABLET | Freq: Once | ORAL | Status: AC
  Administered 2011-12-15: 1 via ORAL

## 2011-12-15 MED ORDER — ONDANSETRON HCL 4 MG/2ML IJ SOLN
4.0000 mg | Freq: Once | INTRAMUSCULAR | Status: AC
  Filled 2011-12-15: qty 2

## 2011-12-15 MED ORDER — HYDROMORPHONE HCL PF 1 MG/ML IJ SOLN
1.0000 mg | Freq: Once | INTRAMUSCULAR | Status: AC
  Administered 2011-12-15: 1 mg via INTRAVENOUS
  Filled 2011-12-15: qty 1

## 2011-12-15 MED ORDER — IOHEXOL 300 MG/ML  SOLN
20.0000 mL | INTRAMUSCULAR | Status: AC
  Administered 2011-12-15: 20 mL via ORAL

## 2011-12-15 MED ORDER — OXYCODONE-ACETAMINOPHEN 5-325 MG PO TABS: 1.0000 | ORAL_TABLET | ORAL | Status: DC | PRN

## 2011-12-15 MED ORDER — IOHEXOL 300 MG/ML  SOLN
100.0000 mL | Freq: Once | INTRAMUSCULAR | Status: AC | PRN
  Administered 2011-12-15: 100 mL via INTRAVENOUS

## 2011-12-15 MED ORDER — HYDROMORPHONE HCL PF 1 MG/ML IJ SOLN
1.0000 mg | Freq: Once | INTRAMUSCULAR | Status: AC
  Filled 2011-12-15: qty 1

## 2011-12-15 NOTE — ED Notes (Signed)
States pelvic pain with nausea since Monday. Hx of fibroids. Denies urinary sx or vaginal d/c.

## 2011-12-15 NOTE — ED Notes (Signed)
Patient denies pain and is resting comfortably.  

## 2011-12-15 NOTE — ED Provider Notes (Signed)
History     CSN: [id number]  Arrival date & time 12/15/11  1478   First MD Initiated Contact with Patient 12/15/11 [phone]      Chief Complaint  Patient presents with  . Abdominal Pain    (Consider location/radiation/quality/duration/timing/severity/associated sxs/prior treatment) HPI Comments: 46 y/o [id number] female h/o sarcoidosis, uterine fibroids s/p oophorectomy and appendectomy. Here c/o 3 days with low abdominal pain worsening associated with nausea no vomiting, vaginal discharge white thin, No vaginal bleeding, last normal bowel movement last night, no diarrhea,  Does not remember last menstrual period (sates she still has irregular periods) No fever or burning on urination or blood in urine. Pain more in left low abdomen with radiation to groin and leg.      Past Medical History  Diagnosis Date  . Hypertension   . Ovarian cyst   . Fibroid tumor   . PVC (premature ventricular contraction)   . Bifascicular block   . Sarcoidosis     Past Surgical History  Procedure Date  . Ovarian cyst removal   . Tonsillectomy   . Appendectomy 1988  . Partial hysterectomy 1988    right  . Tubal ligation 1991  . Bladder surgery before age 9    bladder/kidney surgery to reconnect tubes.    Family History  Problem Relation Age of Onset  . Anemia Sister   . Anemia Sister   . Diabetes Mother   . Heart disease Mother   . Hypertension Mother   . Diabetes Father   . Hypertension Father   . Scleroderma Father     History  Substance Use Topics  . Smoking status: Never Smoker   . Smokeless tobacco: Never Used  . Alcohol Use: No    OB History    Grav Para Term Preterm Abortions TAB SAB Ect Mult Living                  Review of Systems  Constitutional: Negative for fever and chills.  HENT: Negative for congestion, sore throat, mouth sores and trouble swallowing.   Respiratory: Negative for cough, chest tightness and shortness of breath.   Gastrointestinal: Positive for  nausea and abdominal pain. Negative for vomiting, diarrhea, constipation and blood in stool.  Genitourinary: Positive for vaginal discharge, menstrual problem and pelvic pain. Negative for dysuria, frequency, hematuria, flank pain and vaginal bleeding.  Skin: Negative for rash.  Neurological: Positive for dizziness and light-headedness. Negative for headaches.    Allergies  Review of patient's allergies indicates no known allergies.  Home Medications   Current Outpatient Rx  Name Route Sig Dispense Refill  . ACETAMINOPHEN 325 MG PO TABS Oral Take 650 mg by mouth every 6 (six) hours as needed.      Marland Kitchen HYDROCHLOROTHIAZIDE 25 MG PO TABS Oral Take 1 tablet by mouth daily.    Marland Kitchen METOPROLOL TARTRATE 25 MG PO TABS Oral Take 12.5 mg by mouth 2 (two) times daily.      Marland Kitchen NAPROXEN SODIUM 220 MG PO TABS Oral Take 220 mg by mouth 2 (two) times daily with a meal.      . PREDNISONE 20 MG PO TABS Oral Take 10 mg by mouth daily.       BP 120/74  Pulse 86  Temp(Src) 98 F (36.7 C) (Oral)  Resp 18  SpO2 100%  Physical Exam  Nursing note and vitals reviewed. Constitutional: She is oriented to person, place, and time. She appears well-developed and well-nourished.  Uncomfortable with pain. Obese.  HENT:  Head: Normocephalic and atraumatic.  Eyes: Pupils are equal, round, and reactive to light. No scleral icterus.  Neck: No JVD present.  Cardiovascular: Normal rate, regular rhythm and normal heart sounds.   Pulmonary/Chest: Breath sounds normal.  Abdominal: Soft. She exhibits no distension.       Obese abdomen impress decreased BS. Significant tenderness to palpation in left lower abdomen, no rigidity rebound or guarding. Feel irregular mass at level of umbilicus likely irregular uterine fundus. No ascitics or hepatosplenomegaly.  Genitourinary:       White thin vaginal discharge, with no obvious lesion in vaginal walls. There is small ulcer in cervix mucosa at level of o'clock. Bimanual  exam: reported tenderness to cervical motion. Impress very enlarged irregular uterus.  Samples for wet prep and GC/CHl taken.  Lymphadenopathy:    She has no cervical adenopathy.  Neurological: She is alert and oriented to person, place, and time.  Skin: No rash noted.    ED Course  Procedures (including critical care time)  Labs Reviewed  POCT URINALYSIS DIP (DEVICE) - Abnormal; Notable for the following:    Bilirubin Urine SMALL (*)    Hgb urine dipstick TRACE (*)    Protein, ur 100 (*)    All other components within normal limits  POCT PREGNANCY, URINE  POCT URINALYSIS DIPSTICK  POCT PREGNANCY, URINE   No results found.   No diagnosis found.    MDM  Severe low abdominal pain and nausea in patient with fibromatoid uterus. Difficult exam due to body habitus and impress irregular enlarged uterus. Concerned for torsion or necrotizing of uterine fibroid vs left adnexa pathology. In any case I think Ms. Bodie might require imaging studies for better characterization of her pelvic pain and Gyn referral as she also has ulceration in her cervix that requires pap smear. Patient was transfer to the emergency department.          Sharin Grave, MD 12/15/11 1520

## 2011-12-15 NOTE — ED Notes (Signed)
Patient transported to CT 

## 2011-12-15 NOTE — ED Notes (Signed)
Pt now states increased pain. PA aware, order given.

## 2011-12-15 NOTE — ED Provider Notes (Signed)
Evaluation and management procedures were performed by the PA/NP under my supervision/collaboration.    Felisa Bonier, MD 12/15/11 [phone]

## 2011-12-15 NOTE — ED Provider Notes (Signed)
Evaluation and management procedures were performed by the PA/NP under my supervision/collaboration.   Felisa Bonier, MD 12/15/11 Zollie Pee

## 2011-12-15 NOTE — ED Notes (Signed)
PA performing pelvic exam

## 2011-12-15 NOTE — ED Notes (Signed)
Patient was sent from ucc for further eval of lower abd pain.  She has hx of fibroids.  She is also complaining of intermittent nausea.

## 2011-12-15 NOTE — ED Notes (Signed)
Husband at bedside.  Explained to patient the purpose for transfer, transfer by shuttle

## 2011-12-15 NOTE — ED Provider Notes (Signed)
History     CSN: [id number]  Arrival date & time 12/15/11  1054   None     Chief Complaint  Patient presents with  . Abdominal Pain    (Consider location/radiation/quality/duration/timing/severity/associated sxs/prior treatment) HPI  Past Medical History  Diagnosis Date  . Hypertension   . Ovarian cyst   . Fibroid tumor   . PVC (premature ventricular contraction)   . Bifascicular block   . Sarcoidosis     Past Surgical History  Procedure Date  . Ovarian cyst removal   . Tonsillectomy   . Appendectomy 1988  . Partial hysterectomy 1988    right  . Tubal ligation 1991  . Bladder surgery before age 74    bladder/kidney surgery to reconnect tubes.    Family History  Problem Relation Age of Onset  . Anemia Sister   . Anemia Sister   . Diabetes Mother   . Heart disease Mother   . Hypertension Mother   . Diabetes Father   . Hypertension Father   . Scleroderma Father     History  Substance Use Topics  . Smoking status: Never Smoker   . Smokeless tobacco: Never Used  . Alcohol Use: No    OB History    Grav Para Term Preterm Abortions TAB SAB Ect Mult Living                  Review of Systems  Allergies  Review of patient's allergies indicates no known allergies.  Home Medications   Current Outpatient Rx  Name Route Sig Dispense Refill  . ACETAMINOPHEN 500 MG PO TABS Oral Take 1,000 mg by mouth every 6 (six) hours as needed. For pain.     Marland Kitchen HYDROCHLOROTHIAZIDE 25 MG PO TABS Oral Take 1 tablet by mouth daily.    Marland Kitchen METOPROLOL TARTRATE 25 MG PO TABS Oral Take 12.5 mg by mouth 2 (two) times daily.      Marland Kitchen NAPROXEN SODIUM 220 MG PO TABS Oral Take 220 mg by mouth 2 (two) times daily with a meal.        BP 142/90  Pulse 89  Temp(Src) 97.7 F (36.5 C) (Oral)  Resp 18  Ht 5\' 11"  (1.803 m)  Wt 308 lb (139.708 kg)  BMI 42.96 kg/m2  SpO2 99%  Physical Exam  ED Course  Procedures (including critical care time)  Labs Reviewed  DIFFERENTIAL -  Abnormal; Notable for the following:    Monocytes Absolute 1.1 (*)    All other components within normal limits  BASIC METABOLIC PANEL - Abnormal; Notable for the following:    Potassium 3.4 (*)    Glucose, Bld 107 (*)    GFR calc non Af Amer 73 (*)    GFR calc Af Amer 85 (*)    All other components within normal limits  URINALYSIS, ROUTINE W REFLEX MICROSCOPIC - Abnormal; Notable for the following:    Color, Urine AMBER (*) BIOCHEMICALS MAY BE AFFECTED BY COLOR   Specific Gravity, Urine 1.031 (*)    Bilirubin Urine SMALL (*)    Protein, ur 100 (*)    All other components within normal limits  URINE MICROSCOPIC-ADD ON - Abnormal; Notable for the following:    Squamous Epithelial / LPF FEW (*)    Bacteria, UA FEW (*)    All other components within normal limits  CBC   No results found.   No diagnosis found.    MDM  1400:  Moved to CDU awaiting abdominal/vaginal u/s  for R ovarian pain on exam. Will get CT abdomnen if u/s is normal per [place] PA.   1530 Reported to [place] PA .  Awaiting u/s for LLQ pain.       Jethro Bastos, NP 12/15/11 1610

## 2011-12-15 NOTE — ED Notes (Signed)
Patient transported to Ultrasound 

## 2011-12-15 NOTE — ED Notes (Signed)
Patient denies pain and is resting comfortably. Awaiting ct results.

## 2011-12-15 NOTE — ED Provider Notes (Signed)
4:16 PM  patient care resumed from [facility] [employer].  Patient presented with left adnexal tenderness via pelvic exam.  Transvaginal and abdominal ultrasound was ordered however could not rule out ovarian torsion.  A CT of the abdomen and pelvis with contrast will be ordered for vital visualization of the left lower quadrant.  Patient's pain is currently being managed in the emergency department with Dilaudid.  Patient's vital signs have been reviewed and she is hemodynamically stable, in no acute distress, and understands the reasoning for further studies being performed.     CT IMPRESSION:  1. No acute abnormality. 2. Markedly enlarged fibroid uterus. 3. Two adjacent right adrenal probable adenomas  US IMPRESSION: Markedly enlarged fibroid uterus compromises evaluation of the endometrium. The left ovary could not be seen either transabdominally or endovaginally. Although no definite adnexal mass or pelvic fluid was suggested transabdominally, adequate assessment for torsion is not possible with this exam. If further evaluation for the patient's left adnexal pain is desired, pelvic CT or MRI would be recommended.  Original Report Authenticated By: Bertha Stakes, M.D.  Patient has been advised to followup with OB/GYN in regards to her adnexal pain & uterine fibroids.  She will be discharged with Percocet for pain management.  Patient is currently hemodynamically stable in no acute distress and states that her pain has been managed in the emergency department.  She is agreeable with plan to discharge and states she will followup.  [place], [place] 12/15/11 1950

## 2011-12-15 NOTE — ED Notes (Signed)
Patient denies pain and is resting comfortably. Analgesia ordered canceled.

## 2011-12-15 NOTE — ED Notes (Signed)
Notified physician.

## 2011-12-15 NOTE — ED Provider Notes (Signed)
History     CSN: [id number]  Arrival date & time 12/15/11  1054   None     Chief Complaint  Patient presents with  . Abdominal Pain    (Consider location/radiation/quality/duration/timing/severity/associated sxs/prior treatment) HPI  Patient seen in urgent care prior to arrival for lower dominant pain is sent to ER for further evaluation with patient complaining of a few day history of left lower quadrant pain with pain constant with waxing waning increases in pain. Patient states she has history of fibroid uterus with similar waxing and waning pain in the past. Patient states associated nausea but denies vomiting or diarrhea. Patient states she's taken over-the-counter pain meds without relief of pain. Patient has history of right oophorectomy for hx of cystic ovary and states she's had 2 surgeries for uterine fibroid removal does not currently have an OB/GYN. Patient's pain was gradual onset, persistent, and unchanging with waxing waning intensity of pain. Denies fevers or chills. She denies dysuria or hematuria as well as vaginal discharge. Patient states she has not been sexually active in "a very very long time" stating she is currently separated from her husband. She has history of diverticulosis or diverticulitis and denies blood in her stool. Eyes aggravating or alleviating factors.  wetprep at [facility] showed only WBC with GC/chlamydia sent to lab and negative Upreg  Past Medical History  Diagnosis Date  . Hypertension   . Ovarian cyst   . Fibroid tumor   . PVC (premature ventricular contraction)   . Bifascicular block   . Sarcoidosis     Past Surgical History  Procedure Date  . Ovarian cyst removal   . Tonsillectomy   . Appendectomy 1988  . Partial hysterectomy 1988    right  . Tubal ligation 1991  . Bladder surgery before age 47    bladder/kidney surgery to reconnect tubes.    Family History  Problem Relation Age of Onset  . Anemia Sister   . Anemia Sister   .  Diabetes Mother   . Heart disease Mother   . Hypertension Mother   . Diabetes Father   . Hypertension Father   . Scleroderma Father     History  Substance Use Topics  . Smoking status: Never Smoker   . Smokeless tobacco: Never Used  . Alcohol Use: No    OB History    Grav Para Term Preterm Abortions TAB SAB Ect Mult Living                  Review of Systems  All other systems reviewed and are negative.    Allergies  Review of patient's allergies indicates no known allergies.  Home Medications   Current Outpatient Rx  Name Route Sig Dispense Refill  . ACETAMINOPHEN 500 MG PO TABS Oral Take 1,000 mg by mouth every 6 (six) hours as needed. For pain.     Marland Kitchen HYDROCHLOROTHIAZIDE 25 MG PO TABS Oral Take 1 tablet by mouth daily.    Marland Kitchen METOPROLOL TARTRATE 25 MG PO TABS Oral Take 12.5 mg by mouth 2 (two) times daily.      Marland Kitchen NAPROXEN SODIUM 220 MG PO TABS Oral Take 220 mg by mouth 2 (two) times daily with a meal.        BP 142/90  Pulse 89  Temp(Src) 97.7 F (36.5 C) (Oral)  Resp 18  Ht 5\' 11"  (1.803 m)  Wt 308 lb (139.708 kg)  BMI 42.96 kg/m2  SpO2 99%  Physical Exam  Nursing note  and vitals reviewed. Constitutional: She is oriented to person, place, and time. She appears well-developed and well-nourished. No distress.       Morbidly obese  HENT:  Head: Normocephalic and atraumatic.  Eyes: Conjunctivae are normal.  Neck: Normal range of motion. Neck supple.  Cardiovascular: Normal rate, regular rhythm, normal heart sounds and intact distal pulses.  Exam reveals no gallop and no friction rub.   No murmur heard. Pulmonary/Chest: Effort normal and breath sounds normal. No respiratory distress. She has no wheezes. She has no rales. She exhibits no tenderness.  Abdominal: Bowel sounds are normal. She exhibits no distension and no mass. There is tenderness in the left lower quadrant. There is no rigidity, no rebound, no guarding and no CVA tenderness.  Genitourinary: Vagina  normal. Uterus is tender. Cervix exhibits no motion tenderness and no discharge. Left adnexum displays tenderness. Left adnexum displays no mass.  Musculoskeletal: Normal range of motion. She exhibits no edema and no tenderness.  Neurological: She is alert and oriented to person, place, and time.  Skin: Skin is warm and dry. No rash noted. She is not diaphoretic. No erythema.  Psychiatric: She has a normal mood and affect.    ED Course  Procedures (including critical care time)  IV morphine and zofran   Patient pending US pelvis/transvaginal for left adnexal TTP. No CMT. Report given to Remi Haggard NP who will re-evaluate patient after [place] to determine further evaluation and plan. patient is afebrile and nontoxic appearing with VSS.   Labs Reviewed  DIFFERENTIAL - Abnormal; Notable for the following:    Monocytes Absolute 1.1 (*)    All other components within normal limits  BASIC METABOLIC PANEL - Abnormal; Notable for the following:    Potassium 3.4 (*)    Glucose, Bld 107 (*)    GFR calc non Af Amer 73 (*)    GFR calc Af Amer 85 (*)    All other components within normal limits  URINALYSIS, ROUTINE W REFLEX MICROSCOPIC - Abnormal; Notable for the following:    Color, Urine AMBER (*) BIOCHEMICALS MAY BE AFFECTED BY COLOR   Specific Gravity, Urine 1.031 (*)    Bilirubin Urine SMALL (*)    Protein, ur 100 (*)    All other components within normal limits  URINE MICROSCOPIC-ADD ON - Abnormal; Notable for the following:    Squamous Epithelial / LPF FEW (*)    Bacteria, UA FEW (*)    All other components within normal limits  CBC   No results found.   No diagnosis found.    MDM  dispo pending [place] and reassessment by [employer]. No CMT, afebrile. No peritoneal signs.         Jenness Corner, PA 12/15/11 1417  Jenness Corner, PA 12/15/11 1428

## 2011-12-15 NOTE — ED Notes (Signed)
Returned from ultrasound.

## 2011-12-15 NOTE — ED Notes (Signed)
Patient felt lightheaded while sitting, reclined exam table.  Due to c/o lightheadedness, assisted patient with undressing, positioning.  Lightheadedness resolved with reclining and denies previous episodes.

## 2011-12-15 NOTE — ED Notes (Signed)
Onset of pain Monday, c/o abdominal pain. Points to low abdomen as location of pain.  Reports has had surgeries for fibroids.  Patient reports last episode like this was 2 years ago and seen at [facility] in [place].  Denies urinary symptoms.  Last bm was last night and normal per patient.  Patient reports nausea, no vomiting. Patient reports a white discharge she relates to "ovulation".

## 2011-12-15 NOTE — ED Notes (Signed)
Oral contrast given.

## 2011-12-16 ENCOUNTER — Emergency Department (HOSPITAL_COMMUNITY)
Admission: EM | Admit: 2011-12-16 | Discharge: 2011-12-17 | Disposition: A | Discharge status: Home / Self Care | Payer: Managed Care, Other (non HMO) | Attending: Emergency Medicine | Admitting: Emergency Medicine

## 2011-12-16 ENCOUNTER — Encounter (HOSPITAL_COMMUNITY): Payer: Self-pay

## 2011-12-16 DIAGNOSIS — R109 Unspecified abdominal pain: Secondary | ICD-10-CM | POA: Insufficient documentation

## 2011-12-16 DIAGNOSIS — N949 Unspecified condition associated with female genital organs and menstrual cycle: Secondary | ICD-10-CM | POA: Insufficient documentation

## 2011-12-16 DIAGNOSIS — D259 Leiomyoma of uterus, unspecified: Secondary | ICD-10-CM | POA: Insufficient documentation

## 2011-12-16 DIAGNOSIS — R21 Rash and other nonspecific skin eruption: Secondary | ICD-10-CM | POA: Insufficient documentation

## 2011-12-16 DIAGNOSIS — D219 Benign neoplasm of connective and other soft tissue, unspecified: Secondary | ICD-10-CM

## 2011-12-16 DIAGNOSIS — Z79899 Other long term (current) drug therapy: Secondary | ICD-10-CM | POA: Insufficient documentation

## 2011-12-16 DIAGNOSIS — D869 Sarcoidosis, unspecified: Secondary | ICD-10-CM | POA: Insufficient documentation

## 2011-12-16 DIAGNOSIS — R300 Dysuria: Secondary | ICD-10-CM | POA: Insufficient documentation

## 2011-12-16 DIAGNOSIS — Z9889 Other specified postprocedural states: Secondary | ICD-10-CM | POA: Insufficient documentation

## 2011-12-16 DIAGNOSIS — I10 Essential (primary) hypertension: Secondary | ICD-10-CM | POA: Insufficient documentation

## 2011-12-16 NOTE — ED Notes (Signed)
Pt presents with 4 day h/o "all over" abdominal pain that has been constant.  Pt reports she was seen here yesterday for same, with workup revealing "too many fibroids".  Pt reports dysuria began today, pt reports she has not slept x 3-4 days.  Pt reports she got percocet 10 mg, pt has doubled medication which has not helped.

## 2011-12-16 NOTE — ED Provider Notes (Signed)
Medical screening examination/treatment/procedure(s) were performed by non-physician practitioner and as supervising physician I was immediately available for consultation/collaboration.  Raeford Razor, MD 12/16/11 1510

## 2011-12-17 MED ORDER — DIAZEPAM 5 MG PO TABS: 5.0000 mg | ORAL_TABLET | Freq: Three times a day (TID) | ORAL | Status: AC | PRN

## 2011-12-17 MED ORDER — PROMETHAZINE HCL 25 MG PO TABS: 25.0000 mg | ORAL_TABLET | Freq: Four times a day (QID) | ORAL | Status: AC | PRN

## 2011-12-17 MED ORDER — ONDANSETRON 4 MG PO TBDP
8.0000 mg | ORAL_TABLET | Freq: Once | ORAL | Status: AC
  Administered 2011-12-17: 8 mg via ORAL
  Filled 2011-12-17: qty 2

## 2011-12-17 MED ORDER — IBUPROFEN 800 MG PO TABS
800.0000 mg | ORAL_TABLET | Freq: Once | ORAL | Status: AC
  Administered 2011-12-17: 800 mg via ORAL
  Filled 2011-12-17: qty 1

## 2011-12-17 MED ORDER — HYDROMORPHONE HCL PF 2 MG/ML IJ SOLN
2.0000 mg | Freq: Once | INTRAMUSCULAR | Status: AC
  Administered 2011-12-17: 2 mg via INTRAMUSCULAR
  Filled 2011-12-17: qty 1

## 2011-12-17 NOTE — ED Notes (Signed)
Pt  Report given and care endorsed to Suzy Bouchard., RN

## 2011-12-17 NOTE — ED Provider Notes (Signed)
History     CSN: [id number]  Arrival date & time 12/16/11  1751   First MD Initiated Contact with Patient 12/16/11 2246      Chief Complaint  Patient presents with  . Abdominal Pain    HPI: Patient is a 46 y.o. female presenting with abdominal pain.  Abdominal Pain The primary symptoms of the illness include abdominal pain and dysuria. The current episode started more than 2 days ago. The onset of the illness was sudden. The problem has been gradually worsening.  The patient states that she believes she is currently not pregnant. The patient has not had a change in bowel habit. Symptoms associated with the illness do not include chills or anorexia.   patient reports persistent lower abdominal pain. Pain started on Monday and patient was seen yesterday here in the emergency department. Ultrasound revealed numerous large fibroids and she was discharged home with Percocet for medication and followup with gynecologist. Patient has made an appointment with a gynecologist for January 15 but states the Percocet is not managing her pain. She first tried one then 2 at a time and she still can get no relief. States mild dysuria today as well.  Past Medical History  Diagnosis Date  . Hypertension   . Ovarian cyst   . Fibroid tumor   . PVC (premature ventricular contraction)   . Bifascicular block   . Sarcoidosis   . S/P appy     Past Surgical History  Procedure Date  . Ovarian cyst removal   . Tonsillectomy   . Appendectomy 1988  . Partial hysterectomy 1988    right  . Tubal ligation 1991  . Bladder surgery before age 11    bladder/kidney surgery to reconnect tubes.    Family History  Problem Relation Age of Onset  . Anemia Sister   . Anemia Sister   . Diabetes Mother   . Heart disease Mother   . Hypertension Mother   . Diabetes Father   . Hypertension Father   . Scleroderma Father     History  Substance Use Topics  . Smoking status: Never Smoker   . Smokeless tobacco:  Never Used  . Alcohol Use: No    OB History    Grav Para Term Preterm Abortions TAB SAB Ect Mult Living                  Review of Systems  Constitutional: Negative.  Negative for chills.  HENT: Negative.   Eyes: Negative.   Respiratory: Negative.   Cardiovascular: Negative.   Gastrointestinal: Positive for abdominal pain. Negative for anorexia.  Genitourinary: Positive for dysuria.  Musculoskeletal: Negative.   Skin: Negative.   Neurological: Negative.   Hematological: Negative.   Psychiatric/Behavioral: Negative.     Allergies  Review of patient's allergies indicates no known allergies.  Home Medications   Current Outpatient Rx  Name Route Sig Dispense Refill  . ACETAMINOPHEN 500 MG PO TABS Oral Take 1,000 mg by mouth every 6 (six) hours as needed. For pain.     Marland Kitchen HYDROCHLOROTHIAZIDE 25 MG PO TABS Oral Take 1 tablet by mouth daily.    Marland Kitchen METOPROLOL TARTRATE 25 MG PO TABS Oral Take 12.5 mg by mouth 2 (two) times daily.      Marland Kitchen NAPROXEN SODIUM 220 MG PO TABS Oral Take 220 mg by mouth 2 (two) times daily with a meal.      . OXYCODONE-ACETAMINOPHEN 5-325 MG PO TABS Oral Take 1 tablet by mouth  every 4 (four) hours as needed. For pain       BP 108/70  Pulse 97  Temp(Src) 100.5 F (38.1 C) (Oral)  Resp 18  Ht 5\' 11"  (1.803 m)  Wt 308 lb (139.708 kg)  BMI 42.96 kg/m2  SpO2 95%  Physical Exam  Constitutional: She is oriented to person, place, and time. She appears well-developed and well-nourished.       Pt appears objectively in pain, grimaces and guarding w/ movement.  HENT:  Head: Normocephalic and atraumatic.  Eyes: Conjunctivae are normal.  Neck: Neck supple.  Cardiovascular: Normal rate and regular rhythm.   Pulmonary/Chest: Effort normal and breath sounds normal.  Abdominal: Soft. Bowel sounds are normal.  Musculoskeletal: Normal range of motion.  Neurological: She is alert and oriented to person, place, and time.  Skin: Skin is warm and dry. Rash noted.  Rash is papular. No erythema.  Psychiatric: She has a normal mood and affect.    ED Course  Procedures Patient reports significant relief with IM Dilaudid and Valium. Will plan for discharge home, add Valium to her pain medication regimen and encourage her to keep her scheduled appointment with gynecologist on 12/28/2011. Patient is agreeable with plan.  Labs Reviewed - No data to display US Transvaginal Non-ob  12/15/2011  *RADIOLOGY REPORT*  Clinical Data:  Known fibroids.  Left adnexal pain. Post right oophorectomy and salpingectomy.  TRANSABDOMINAL AND TRANSVAGINAL ULTRASOUND OF PELVIS Technique:  Both transabdominal and transvaginal ultrasound examinations of the pelvis were performed. Transabdominal technique was performed for global imaging of the pelvis including uterus, ovaries, adnexal regions, and pelvic cul-de-sac.  Comparison: None   It was necessary to proceed with endovaginal exam following the transabdominal exam to visualize the adnexa Findings:  Uterus: Is enlarged with a sagittal length of approximately 17 cm, depth of approximately 8 cm and a transverse width of approximately 16 cm.  Multiple fibroids are visible transabdominally with the largest seen in the posterior upper uterine segment measuring 14.8 x 13.7 x 14.6 cm.  Smaller fibroids are identified in the anterior midbody measuring 5.4 x 4.8 x 3.8 cm and in the fundal region measuring 6.2 x 5.7 x 5.3 cm.  More diffuse fibroid involvement may be present.  Because of the fibroid load, evaluation of the uterus was markedly compromised endovaginally and only the cervical portion of the uterus was well seen.  Endometrium: Could not be evaluated endovaginally due to the large uterine size.  Transabdominally only portions of the lining could be visualized.  Right ovary: Has been surgically removed  Left ovary: Could not be seen either transabdominally or endovaginally.  Other findings: No pelvic fluid is seen  IMPRESSION: Markedly enlarged  fibroid uterus compromises evaluation of the endometrium.  The left ovary could not be seen either transabdominally or endovaginally.  Although no definite adnexal mass or pelvic fluid was suggested transabdominally, adequate assessment for torsion is not possible with this exam. If further evaluation for the patient's left adnexal pain is desired, pelvic CT or MRI would be recommended.  Original Report Authenticated By: Bertha Stakes, M.D.   US Pelvis Complete  12/15/2011  *RADIOLOGY REPORT*  Clinical Data:  Known fibroids.  Left adnexal pain. Post right oophorectomy and salpingectomy.  TRANSABDOMINAL AND TRANSVAGINAL ULTRASOUND OF PELVIS Technique:  Both transabdominal and transvaginal ultrasound examinations of the pelvis were performed. Transabdominal technique was performed for global imaging of the pelvis including uterus, ovaries, adnexal regions, and pelvic cul-de-sac.  Comparison: None   It was  necessary to proceed with endovaginal exam following the transabdominal exam to visualize the adnexa Findings:  Uterus: Is enlarged with a sagittal length of approximately 17 cm, depth of approximately 8 cm and a transverse width of approximately 16 cm.  Multiple fibroids are visible transabdominally with the largest seen in the posterior upper uterine segment measuring 14.8 x 13.7 x 14.6 cm.  Smaller fibroids are identified in the anterior midbody measuring 5.4 x 4.8 x 3.8 cm and in the fundal region measuring 6.2 x 5.7 x 5.3 cm.  More diffuse fibroid involvement may be present.  Because of the fibroid load, evaluation of the uterus was markedly compromised endovaginally and only the cervical portion of the uterus was well seen.  Endometrium: Could not be evaluated endovaginally due to the large uterine size.  Transabdominally only portions of the lining could be visualized.  Right ovary: Has been surgically removed  Left ovary: Could not be seen either transabdominally or endovaginally.  Other findings: No  pelvic fluid is seen  IMPRESSION: Markedly enlarged fibroid uterus compromises evaluation of the endometrium.  The left ovary could not be seen either transabdominally or endovaginally.  Although no definite adnexal mass or pelvic fluid was suggested transabdominally, adequate assessment for torsion is not possible with this exam. If further evaluation for the patient's left adnexal pain is desired, pelvic CT or MRI would be recommended.  Original Report Authenticated By: Bertha Stakes, M.D.   Ct Abdomen Pelvis W Contrast  12/15/2011  *RADIOLOGY REPORT*  Clinical Data: Bilateral lower abdominal pain, greater on the left. Bladder reflux.  Previous right oophorectomy and uterine fibroid removal.  CT ABDOMEN AND PELVIS WITH CONTRAST  Technique:  Multidetector CT imaging of the abdomen and pelvis was performed following the standard protocol during bolus administration of intravenous contrast.  Contrast: 20mL OMNIPAQUE IOHEXOL 300 MG/ML IV SOLN, OMNIPAQUE IOHEXOL 300 MG/ML IV SOLN  Comparison: 12/25/2005 and pelvic ultrasound obtained earlier today.  Findings: Triangular-shaped atelectasis or scar in the posterior aspect of the right lower lobe.  Markedly enlarged uterus extending into the lower abdomen and containing multiple ill-defined masses.  The largest mass measures 14.8 x 13.4 cm in maximum dimensions on image number 67.  The left ovary is displaced posteriorly and appears grossly normal.  The right ovary is surgically absent.  No gastrointestinal abnormalities or enlarged lymph nodes.  No evidence of appendicitis.  1.8 cm posterior left renal cyst.  Unremarkable right kidney, liver, spleen, pancreas, gallbladder and left adrenal gland.  Two low density masses in the right adrenal gland.  On image number 33, one of these measures 1.8 x 1.4 cm in maximum dimensions and the other measures 1.7 x 1.3 cm in maximum dimensions.  Mild lumbar and lower thoracic spine degenerative changes.  IMPRESSION:  1.   No acute abnormality. 2.  Markedly enlarged fibroid uterus. 3.  Two adjacent right adrenal probable adenomas.  Original Report Authenticated By: Darrol Angel, M.D.   [place] Art/ven Flow Abd Pelv Doppler  12/15/2011  *RADIOLOGY REPORT*  Clinical Data:  Known fibroids.  Left adnexal pain. Post right oophorectomy and salpingectomy.  TRANSABDOMINAL AND TRANSVAGINAL ULTRASOUND OF PELVIS Technique:  Both transabdominal and transvaginal ultrasound examinations of the pelvis were performed. Transabdominal technique was performed for global imaging of the pelvis including uterus, ovaries, adnexal regions, and pelvic cul-de-sac.  Comparison: None   It was necessary to proceed with endovaginal exam following the transabdominal exam to visualize the adnexa Findings:  Uterus: Is enlarged with  a sagittal length of approximately 17 cm, depth of approximately 8 cm and a transverse width of approximately 16 cm.  Multiple fibroids are visible transabdominally with the largest seen in the posterior upper uterine segment measuring 14.8 x 13.7 x 14.6 cm.  Smaller fibroids are identified in the anterior midbody measuring 5.4 x 4.8 x 3.8 cm and in the fundal region measuring 6.2 x 5.7 x 5.3 cm.  More diffuse fibroid involvement may be present.  Because of the fibroid load, evaluation of the uterus was markedly compromised endovaginally and only the cervical portion of the uterus was well seen.  Endometrium: Could not be evaluated endovaginally due to the large uterine size.  Transabdominally only portions of the lining could be visualized.  Right ovary: Has been surgically removed  Left ovary: Could not be seen either transabdominally or endovaginally.  Other findings: No pelvic fluid is seen  IMPRESSION: Markedly enlarged fibroid uterus compromises evaluation of the endometrium.  The left ovary could not be seen either transabdominally or endovaginally.  Although no definite adnexal mass or pelvic fluid was suggested transabdominally,  adequate assessment for torsion is not possible with this exam. If further evaluation for the patient's left adnexal pain is desired, pelvic CT or MRI would be recommended.  Original Report Authenticated By: Bertha Stakes, M.D.     No diagnosis found.    MDM  Newly diagnosed "markedly enlarged fibroid uterus and (R) adnexal adenomas. Additional pain management required.        Leanne Chang, NP 12/17/11 [phone]  Medical screening examination/treatment/procedure(s) were performed by non-physician practitioner and as supervising physician I was immediately available for consultation/collaboration.  Sunnie Nielsen, MD 12/17/11 2022

## 2012-01-07 ENCOUNTER — Ambulatory Visit: Payer: Managed Care, Other (non HMO) | Admitting: Pulmonary Disease

## 2012-01-07 ENCOUNTER — Encounter: Payer: Self-pay | Admitting: Pulmonary Disease

## 2012-01-07 ENCOUNTER — Ambulatory Visit (INDEPENDENT_AMBULATORY_CARE_PROVIDER_SITE_OTHER): Payer: Managed Care, Other (non HMO) | Admitting: Pulmonary Disease

## 2012-01-07 VITALS — BP 134/92 | HR 90 | Temp 98.2°F | Ht 71.0 in | Wt 308.2 lb

## 2012-01-07 DIAGNOSIS — D869 Sarcoidosis, unspecified: Secondary | ICD-10-CM

## 2012-01-07 MED ORDER — PREDNISONE 10 MG PO TABS: ORAL_TABLET | ORAL | Status: DC

## 2012-01-07 NOTE — Assessment & Plan Note (Signed)
The patient has a history of sarcoidosis, and had responded quite well to her prednisone.  She quit taking her medication, and now has symptoms of general malaise and right knee discomfort.  She does not have any pulmonary complaints.  She states her joint pain has not responded to nonsteroidal anti-inflammatory medications.  I would therefore like to start her back on a very low dose of prednisone for the next 6 weeks, and then taper her off again to see how she does.  She is to call if she has recurrence of symptoms.  She may ultimately need a rheumatology evaluation.

## 2012-01-07 NOTE — Progress Notes (Signed)
  Subjective:    Patient ID: [name], female    DOB: [birth year], 46 y.o.   MRN: [id number]  HPI The patient comes in today for follow up of her known pulmonary sarcoidosis.  At the last visit, I had written out a prednisone taper for her, however she lost the prescription and never bothered to call.  She has therefore been off prednisone since her last visit.  She had been doing very well with no symptoms for adrenal insufficiency, but over the last few weeks has had worsening right knee pain.  She denies any trauma or old injury.  She has had some general malaise, and feels similar to the time of her initial diagnosis.  She denies any cough, mucus, or worsening shortness of breath.   Review of Systems  Constitutional: Negative for fever and unexpected weight change.  HENT: Negative for ear pain, nosebleeds, congestion, sore throat, rhinorrhea, sneezing, trouble swallowing, dental problem, postnasal drip and sinus pressure.   Eyes: Negative for redness and itching.  Respiratory: Negative for cough, chest tightness, shortness of breath and wheezing.   Cardiovascular: Negative for palpitations and leg swelling.  Gastrointestinal: Negative for nausea and vomiting.  Genitourinary: Negative for dysuria.  Musculoskeletal: Positive for joint swelling.  Skin: Negative for rash.  Neurological: Negative for headaches.  Hematological: Does not bruise/bleed easily.  Psychiatric/Behavioral: Negative for dysphoric mood. The patient is not nervous/anxious.        Objective:   Physical Exam Overweight female in no acute distress Nose without purulent discharge noted Chest totally clear the patient, no wheezes or rhonchi Cardiac exam with regular rate and rhythm Lower extremities with mild edema, no cyanosis noted Alert and oriented, moves all 4 extremities.        Assessment & Plan:

## 2012-01-07 NOTE — Patient Instructions (Addendum)
Will restart on low dose prednisone for your joint pain, and then try to wean off completely over time.  Please call if having ongoing joint symptoms that are not improving. Prednisone 10mg  one each day for next 3 weeks, then 1/2 each day for next 3 weeks, then take 1/4 each day for one week, then stop. Please call if symptoms worsen as you are tapering prednisone.  followup with me in 4mos.

## 2012-02-24 ENCOUNTER — Ambulatory Visit (INDEPENDENT_AMBULATORY_CARE_PROVIDER_SITE_OTHER): Payer: Managed Care, Other (non HMO) | Admitting: Adult Health

## 2012-02-24 ENCOUNTER — Encounter: Payer: Self-pay | Admitting: Adult Health

## 2012-02-24 VITALS — BP 124/86 | HR 79 | Temp 97.2°F | Ht 71.0 in | Wt 311.8 lb

## 2012-02-24 DIAGNOSIS — D869 Sarcoidosis, unspecified: Secondary | ICD-10-CM

## 2012-02-24 NOTE — Patient Instructions (Addendum)
We are referring you to Rheumatology  We are referring you to Opthamology .  Increase Prednisone 20mg  daily x 1 week then 1 tab daily  follow up Dr. Shelle Iron in 4 weeks and As needed   Delsym As needed  Cough  Please contact office for sooner follow up if symptoms do not improve or worsen or seek emergency care

## 2012-02-29 NOTE — Progress Notes (Signed)
  Subjective:    Patient ID: [name], female    DOB: 1966-04-25, 46 y.o.   MRN: [id number]  HPI 46 yo with known hx of Sarcoid  Ct chest 05/2011:  bilat hilar and mediastinal LN, a few nodular densities in lung fields. EBUS 06/2011:  Non-caseating granuloma PFT's 2012: no obstruction, no restriction, DLCO 74% Prednisone trial starting 07/2011.  02/29/2012 Acute OV  Pt returns for persistent sarcoid symptoms. She was started on low dose trial of steroids 07/2011 for flare of sarcoid symptoms. She tapered her self off steroids ~11/2011 , restarted 1/25 due to joint discomfort. She is currently on 10mg  daily without much help of migratory joint pain. No rash. No visual changes. Also feels achy in chest with dry cough. No fever, exertional chest pain or increased dyspnea. Feels tired all the time, no energy.     Review of Systems Constitutional:   No  weight loss, night sweats,  Fevers, chills,  +fatigue, or  lassitude.  HEENT:   No headaches,  Difficulty swallowing,  Tooth/dental problems, or  Sore throat,                No sneezing, itching, ear ache, nasal congestion, post nasal drip,   CV:  No chest pain,  Orthopnea, PND, swelling in lower extremities, anasarca, dizziness, palpitations, syncope.   GI  No heartburn, indigestion, abdominal pain, nausea, vomiting, diarrhea, change in bowel habits, loss of appetite, bloody stools.   Resp:  No excess mucus, no productive cough,   No coughing up of blood.  No change in color of mucus.  No wheezing.  No chest wall deformity  Skin: no rash or lesions.  GU: no dysuria, change in color of urine, no urgency or frequency.  No flank pain, no hematuria   MS:  No joint  swelling.  No decreased range of motion.  No back pain.  Psych:  No change in mood or affect. No depression or anxiety.  No memory loss.         Objective:   Physical Exam GEN: A/Ox3; pleasant , NAD, well nourished   HEENT:  [place]/AT,  EACs-clear, TMs-wnl, NOSE-clear,  THROAT-clear, no lesions, no postnasal drip or exudate noted.   NECK:  Supple w/ fair ROM; no JVD; normal carotid impulses w/o bruits; no thyromegaly or nodules palpated; no lymphadenopathy.  RESP  Clear  P & A; w/o, wheezes/ rales/ or rhonchi.no accessory muscle use, no dullness to percussion  CARD:  RRR, no m/r/g  , no peripheral edema, pulses intact, no cyanosis or clubbing.  GI:   Soft & nt; nml bowel sounds; no organomegaly or masses detected.  Musco: Warm bil, no deformities or joint swelling noted.   Neuro: alert, no focal deficits noted.    Skin: Warm, no lesions or rashes         Assessment & Plan:

## 2012-02-29 NOTE — Assessment & Plan Note (Signed)
Mild flare of Sarcoid  Will refer to opthalmalogy for surveillance Will refer to rheumatology for assistance with persistent joint discomfort   Plan:  We are referring you to Rheumatology  We are referring you to Opthamology .  Increase Prednisone 20mg  daily x 1 week then 1 tab daily  follow up Dr. Shelle Iron in 4 weeks and As needed   Delsym As needed  Cough  Please contact office for sooner follow up if symptoms do not improve or worsen or seek emergency care

## 2012-02-29 NOTE — Progress Notes (Signed)
Ov reviewed.  Agree with plan as outlined.  

## 2012-03-22 ENCOUNTER — Encounter (HOSPITAL_COMMUNITY): Payer: Self-pay | Admitting: Emergency Medicine

## 2012-03-22 ENCOUNTER — Inpatient Hospital Stay (HOSPITAL_COMMUNITY)
Admission: EM | Admit: 2012-03-22 | Discharge: 2012-03-27 | DRG: 496 | Disposition: A | Discharge status: Home Health | Payer: Managed Care, Other (non HMO) | Source: Ambulatory Visit | Attending: Internal Medicine | Admitting: Internal Medicine

## 2012-03-22 ENCOUNTER — Other Ambulatory Visit (HOSPITAL_BASED_OUTPATIENT_CLINIC_OR_DEPARTMENT_OTHER): Payer: Managed Care, Other (non HMO)

## 2012-03-22 ENCOUNTER — Emergency Department (HOSPITAL_BASED_OUTPATIENT_CLINIC_OR_DEPARTMENT_OTHER)
Admission: EM | Admit: 2012-03-22 | Discharge: 2012-03-22 | Disposition: A | Discharge status: Home / Self Care | Payer: Managed Care, Other (non HMO) | Source: Home / Self Care | Attending: Emergency Medicine | Admitting: Emergency Medicine

## 2012-03-22 ENCOUNTER — Encounter (HOSPITAL_BASED_OUTPATIENT_CLINIC_OR_DEPARTMENT_OTHER): Payer: Self-pay

## 2012-03-22 ENCOUNTER — Emergency Department (HOSPITAL_BASED_OUTPATIENT_CLINIC_OR_DEPARTMENT_OTHER): Payer: Managed Care, Other (non HMO)

## 2012-03-22 ENCOUNTER — Emergency Department (HOSPITAL_COMMUNITY): Payer: Managed Care, Other (non HMO)

## 2012-03-22 DIAGNOSIS — M25519 Pain in unspecified shoulder: Secondary | ICD-10-CM

## 2012-03-22 DIAGNOSIS — K5900 Constipation, unspecified: Secondary | ICD-10-CM | POA: Diagnosis present

## 2012-03-22 DIAGNOSIS — M5412 Radiculopathy, cervical region: Secondary | ICD-10-CM | POA: Insufficient documentation

## 2012-03-22 DIAGNOSIS — R5381 Other malaise: Secondary | ICD-10-CM | POA: Insufficient documentation

## 2012-03-22 DIAGNOSIS — L03114 Cellulitis of left upper limb: Secondary | ICD-10-CM | POA: Diagnosis present

## 2012-03-22 DIAGNOSIS — IMO0002 Reserved for concepts with insufficient information to code with codable children: Secondary | ICD-10-CM | POA: Diagnosis present

## 2012-03-22 DIAGNOSIS — I209 Angina pectoris, unspecified: Secondary | ICD-10-CM | POA: Diagnosis present

## 2012-03-22 DIAGNOSIS — I10 Essential (primary) hypertension: Secondary | ICD-10-CM | POA: Diagnosis present

## 2012-03-22 DIAGNOSIS — M009 Pyogenic arthritis, unspecified: Secondary | ICD-10-CM | POA: Diagnosis present

## 2012-03-22 DIAGNOSIS — D869 Sarcoidosis, unspecified: Secondary | ICD-10-CM | POA: Diagnosis present

## 2012-03-22 DIAGNOSIS — E119 Type 2 diabetes mellitus without complications: Secondary | ICD-10-CM | POA: Diagnosis present

## 2012-03-22 DIAGNOSIS — R300 Dysuria: Secondary | ICD-10-CM | POA: Diagnosis present

## 2012-03-22 DIAGNOSIS — M79609 Pain in unspecified limb: Secondary | ICD-10-CM | POA: Insufficient documentation

## 2012-03-22 DIAGNOSIS — I499 Cardiac arrhythmia, unspecified: Secondary | ICD-10-CM

## 2012-03-22 HISTORY — DX: Angina pectoris, unspecified: I20.9

## 2012-03-22 HISTORY — DX: Cardiac arrhythmia, unspecified: I49.9

## 2012-03-22 HISTORY — DX: Nausea with vomiting, unspecified: R11.2

## 2012-03-22 HISTORY — DX: Pyogenic arthritis, unspecified: M00.9

## 2012-03-22 HISTORY — DX: Other specified postprocedural states: Z98.890

## 2012-03-22 HISTORY — DX: Adverse effect of unspecified anesthetic, initial encounter: T41.45XA

## 2012-03-22 HISTORY — DX: Cellulitis of left upper limb: L03.114

## 2012-03-22 HISTORY — DX: Other complications of anesthesia, initial encounter: T88.59XA

## 2012-03-22 LAB — DIFFERENTIAL
Basophils Absolute: 0.1 10*3/uL (ref 0.0–0.1)
Eosinophils Absolute: 0.4 10*3/uL (ref 0.0–0.7)
Eosinophils Relative: 3 % (ref 0–5)
Lymphocytes Relative: 15 % (ref 12–46)
Lymphs Abs: 1.6 10*3/uL (ref 0.7–4.0)
Monocytes Absolute: 0.9 10*3/uL (ref 0.1–1.0)

## 2012-03-22 LAB — CBC
HCT: 38 % (ref 36.0–46.0)
MCH: 28.3 pg (ref 26.0–34.0)
MCV: 84.8 fL (ref 78.0–100.0)
Platelets: 346 10*3/uL (ref 150–400)
RDW: 15.6 % — ABNORMAL HIGH (ref 11.5–15.5)

## 2012-03-22 LAB — URINALYSIS, ROUTINE W REFLEX MICROSCOPIC
Glucose, UA: NEGATIVE mg/dL
Ketones, ur: 15 mg/dL — AB
pH: 5.5 (ref 5.0–8.0)

## 2012-03-22 LAB — BASIC METABOLIC PANEL
CO2: 26 mEq/L (ref 19–32)
Calcium: 10.1 mg/dL (ref 8.4–10.5)
Creatinine, Ser: 0.8 mg/dL (ref 0.50–1.10)
GFR calc non Af Amer: 86 mL/min — ABNORMAL LOW (ref >90)
Glucose, Bld: 130 mg/dL — ABNORMAL HIGH (ref 70–99)
Sodium: 137 mEq/L (ref 135–145)

## 2012-03-22 LAB — URINE MICROSCOPIC-ADD ON

## 2012-03-22 LAB — C-REACTIVE PROTEIN: CRP: 25.55 mg/dL — ABNORMAL HIGH (ref <0.60)

## 2012-03-22 MED ORDER — CLINDAMYCIN PHOSPHATE 600 MG/50ML IV SOLN
600.0000 mg | Freq: Three times a day (TID) | INTRAVENOUS | Status: DC
  Administered 2012-03-22 – 2012-03-24 (×6): 600 mg via INTRAVENOUS
  Filled 2012-03-22 (×7): qty 50

## 2012-03-22 MED ORDER — HYDROMORPHONE HCL PF 1 MG/ML IJ SOLN
1.0000 mg | Freq: Once | INTRAMUSCULAR | Status: AC
  Administered 2012-03-22: 1 mg via INTRAVENOUS
  Filled 2012-03-22: qty 1

## 2012-03-22 MED ORDER — HEPARIN SODIUM (PORCINE) 5000 UNIT/ML IJ SOLN
5000.0000 [IU] | Freq: Three times a day (TID) | INTRAMUSCULAR | Status: DC
  Filled 2012-03-22 (×3): qty 1

## 2012-03-22 MED ORDER — HYDROCHLOROTHIAZIDE 25 MG PO TABS
25.0000 mg | ORAL_TABLET | Freq: Once | ORAL | Status: AC
  Administered 2012-03-22: 25 mg via ORAL
  Filled 2012-03-22: qty 1

## 2012-03-22 MED ORDER — ONDANSETRON HCL 4 MG PO TABS: 4.0000 mg | ORAL_TABLET | Freq: Four times a day (QID) | ORAL | Status: DC | PRN

## 2012-03-22 MED ORDER — DIAZEPAM 5 MG PO TABS: 5.0000 mg | ORAL_TABLET | Freq: Once | ORAL | Status: DC

## 2012-03-22 MED ORDER — PREDNISONE 20 MG PO TABS
10.0000 mg | ORAL_TABLET | Freq: Every day | ORAL | Status: DC
  Administered 2012-03-22: 10 mg via ORAL
  Filled 2012-03-22: qty 0.5
  Filled 2012-03-22: qty 1

## 2012-03-22 MED ORDER — ONDANSETRON HCL 4 MG/2ML IJ SOLN: 4.0000 mg | Freq: Four times a day (QID) | INTRAMUSCULAR | Status: DC | PRN

## 2012-03-22 MED ORDER — HEPARIN SODIUM (PORCINE) 1000 UNIT/ML IJ SOLN
5000.0000 [IU] | Freq: Three times a day (TID) | INTRAMUSCULAR | Status: DC
  Filled 2012-03-22 (×2): qty 5

## 2012-03-22 MED ORDER — MORPHINE SULFATE 4 MG/ML IJ SOLN
6.0000 mg | Freq: Once | INTRAMUSCULAR | Status: AC
  Administered 2012-03-22: 4 mg via INTRAVENOUS
  Filled 2012-03-22: qty 1

## 2012-03-22 MED ORDER — HYDROMORPHONE HCL 2 MG PO TABS
ORAL_TABLET | ORAL | Status: AC
  Filled 2012-03-22: qty 1

## 2012-03-22 MED ORDER — HYDROMORPHONE HCL 2 MG PO TABS
2.0000 mg | ORAL_TABLET | Freq: Four times a day (QID) | ORAL | Status: DC | PRN
  Administered 2012-03-22: 2 mg via ORAL

## 2012-03-22 MED ORDER — CLINDAMYCIN PHOSPHATE 600 MG/50ML IV SOLN
600.0000 mg | Freq: Once | INTRAVENOUS | Status: AC
  Administered 2012-03-22: 600 mg via INTRAVENOUS
  Filled 2012-03-22: qty 50

## 2012-03-22 MED ORDER — IOHEXOL 300 MG/ML  SOLN
100.0000 mL | Freq: Once | INTRAMUSCULAR | Status: AC | PRN
  Administered 2012-03-22: 100 mL via INTRAVENOUS

## 2012-03-22 MED ORDER — OXYCODONE-ACETAMINOPHEN 5-325 MG PO TABS
1.0000 | ORAL_TABLET | ORAL | Status: DC | PRN
  Administered 2012-03-22 – 2012-03-24 (×9): 1 via ORAL
  Filled 2012-03-22 (×9): qty 1

## 2012-03-22 MED ORDER — HYDROCHLOROTHIAZIDE 25 MG PO TABS
25.0000 mg | ORAL_TABLET | Freq: Every day | ORAL | Status: DC
  Administered 2012-03-23 – 2012-03-27 (×5): 25 mg via ORAL
  Filled 2012-03-22 (×6): qty 1

## 2012-03-22 MED ORDER — HEPARIN SODIUM (PORCINE) 5000 UNIT/ML IJ SOLN
INTRAMUSCULAR | Status: AC
  Administered 2012-03-23: 5000 [IU] via SUBCUTANEOUS
  Filled 2012-03-22: qty 1

## 2012-03-22 MED ORDER — KETOROLAC TROMETHAMINE 30 MG/ML IJ SOLN
30.0000 mg | Freq: Once | INTRAMUSCULAR | Status: AC
  Administered 2012-03-22: 30 mg via INTRAVENOUS
  Filled 2012-03-22: qty 1

## 2012-03-22 MED ORDER — METOPROLOL TARTRATE 12.5 MG HALF TABLET
12.5000 mg | ORAL_TABLET | Freq: Two times a day (BID) | ORAL | Status: DC
  Administered 2012-03-22 – 2012-03-27 (×11): 12.5 mg via ORAL
  Filled 2012-03-22 (×13): qty 1

## 2012-03-22 MED ORDER — HEPARIN SODIUM (PORCINE) 5000 UNIT/ML IJ SOLN
5000.0000 [IU] | Freq: Three times a day (TID) | INTRAMUSCULAR | Status: DC
  Administered 2012-03-22 – 2012-03-23 (×2): 5000 [IU] via SUBCUTANEOUS
  Filled 2012-03-22 (×5): qty 1

## 2012-03-22 MED ORDER — MORPHINE SULFATE 2 MG/ML IJ SOLN
INTRAMUSCULAR | Status: AC
  Administered 2012-03-22: 2 mg via INTRAVENOUS
  Filled 2012-03-22: qty 1

## 2012-03-22 MED ORDER — METHYLPREDNISOLONE SODIUM SUCC 125 MG IJ SOLR
60.0000 mg | Freq: Two times a day (BID) | INTRAMUSCULAR | Status: DC
  Administered 2012-03-22 – 2012-03-26 (×8): 60 mg via INTRAVENOUS
  Filled 2012-03-22 (×2): qty 0.96
  Filled 2012-03-22: qty 2
  Filled 2012-03-22 (×7): qty 0.96

## 2012-03-22 MED ORDER — CLINDAMYCIN PHOSPHATE 600 MG/50ML IV SOLN
600.0000 mg | Freq: Three times a day (TID) | INTRAVENOUS | Status: DC
  Filled 2012-03-22: qty 50

## 2012-03-22 NOTE — ED Notes (Signed)
Tatyana, PA into see pt.

## 2012-03-22 NOTE — Progress Notes (Signed)
*  PRELIMINARY RESULTS* Vascular Ultrasound Left Upper Extremity Venous Duplex has been completed.    No evidence of deep or superficial vein thrombosis of the left upper extremity. All veins compressible.   Malachy Moan 03/22/2012, 11:56 AM

## 2012-03-22 NOTE — ED Notes (Signed)
Patient transported to Ultrasound 

## 2012-03-22 NOTE — ED Notes (Signed)
Tatyana into see pt.

## 2012-03-22 NOTE — ED Notes (Signed)
MRI Tech called to report that pt has too large a girth to fit in the MRI machine. Called teaching service resident and informed and test and diazepam was cancelled.

## 2012-03-22 NOTE — ED Notes (Signed)
[id number] Ready

## 2012-03-22 NOTE — ED Notes (Signed)
Onset one week ago left upper extremity pain. Seen at a hospital one week ago discharged pain continued [facility] sent to [facility] for MRI of left upper extremity.  Pain currently 3/10 achy at rest was given Dilaudid and Toradol prior to arrival.

## 2012-03-22 NOTE — Discharge Instructions (Signed)
Go directly to the [facility] emergency department for admission to the CDU to obtain your MRI of your cervical spine.  Cervical Radiculopathy Cervical radiculopathy happens when a nerve in the neck is pinched or bruised by a slipped (herniated) disk or by arthritic changes in the bones of the cervical spine. This can occur due to an injury or as part of the normal aging process. Pressure on the cervical nerves can cause pain or numbness that runs from your neck all the way down into your arm and fingers. CAUSES  There are many possible causes, including:  Injury.   Muscle tightness in the neck from overuse.   Swollen, painful joints (arthritis).   Breakdown or degeneration in the bones and joints of the spine (spondylosis) due to aging.   Bone spurs that may develop near the cervical nerves.  SYMPTOMS  Symptoms include pain, weakness, or numbness in the affected arm and hand. Pain can be severe or irritating. Symptoms may be worse when extending or turning the neck. DIAGNOSIS  Your caregiver will ask about your symptoms and do a physical exam. He or she may test your strength and reflexes. X-rays, CT scans, and MRI scans may be needed in cases of injury or if the symptoms do not go away after a period of time. Electromyography (EMG) or nerve conduction testing may be done to study how your nerves and muscles are working. TREATMENT  Your caregiver may recommend certain exercises to help relieve your symptoms. Cervical radiculopathy can, and often does, get better with time and treatment. If your problems continue, treatment options may include:  Wearing a soft collar for short periods of time.   Physical therapy to strengthen the neck muscles.   Medicines, such as nonsteroidal anti-inflammatory drugs (NSAIDs), oral corticosteroids, or spinal injections.   Surgery. Different types of surgery may be done depending on the cause of your problems.  HOME CARE INSTRUCTIONS   Put ice on the  affected area.   Put ice in a plastic bag.   Place a towel between your skin and the bag.   Leave the ice on for 15 to 20 minutes, 3 to 4 times a day or as directed by your caregiver.   Use a flat pillow when you sleep.   Only take over-the-counter or prescription medicines for pain, discomfort, or fever as directed by your caregiver.   If physical therapy was prescribed, follow your caregiver's directions.   If a soft collar was prescribed, use it as directed.  SEEK IMMEDIATE MEDICAL CARE IF:   Your pain gets much worse and cannot be controlled with medicines.   You have weakness or numbness in your hand, arm, face, or leg.   You have a high fever or a stiff, rigid neck.   You lose bowel or bladder control (incontinence).   You have trouble with walking, balance, or speaking.  MAKE SURE YOU:   Understand these instructions.   Will watch your condition.   Will get help right away if you are not doing well or get worse.  Document Released: 08/24/2001 Document Revised: 11/18/2011 Document Reviewed: 07/13/2011 [facility] Patient Information 2012 [place], [place].

## 2012-03-22 NOTE — Consult Note (Signed)
ORTHOPAEDIC CONSULTATION  REQUESTING PHYSICIAN: Farley Ly, MD  Chief Complaint: Left shoulder pain  HPI: [name] is a 46 y.o. female who complains of  left shoulder pain since last Wednesday. It is rated as severe, although significantly improved since receiving Percocet. She says it began up around her neck, around her trapezius, and involve the entirety of the arm, down and her hand. She denies tingling or numbness. She says she's never had anything like this before. There was no trauma. This is insidious in onset. She has never had a history of shoulder problems before. She was seen on Friday at the region of, x-rays were taken, and she said she was diagnosed with "calcium deposits" and also given ciprofloxacin for the treatment of a urinary tract infection. She denies fevers recently, but says that she did have some chills last Wednesday. She has not had any since then. She has a past history of sarcoidosis as well as angina. She currently denies any shortness of breath or chest pain.  Past Medical History  Diagnosis Date  . Hypertension   . Ovarian cyst   . Fibroid tumor   . Sarcoidosis   . Complication of anesthesia     "felt like it was difficult to come out; very nauseated"  . PONV (postoperative nausea and vomiting)   . Angina   . PVC (premature ventricular contraction)   . Bifascicular block   . Arrhythmia 03/22/12    "I have an irregular heartbeat all the time"   Past Surgical History  Procedure Date  . Ovarian cyst removal 1986    "included right tube removed"  . Tubal ligation 1991  . Bladder surgery 1966    bladder/kidney surgery to reconnect tubes.  . Uterine fibroid surgery     "three since 1986"  . Tonsillectomy and adenoidectomy 1984  . Right oophorectomy 1986  . Appendectomy 1988   History   Social History  . Marital Status: Legally Separated    Spouse Name: N/A    Number of Children: 1  . Years of Education: N/A   Occupational  History  . education recruiter    Social History Main Topics  . Smoking status: Never Smoker   . Smokeless tobacco: Never Used  . Alcohol Use: No  . Drug Use: No  . Sexually Active: Not Currently   Other Topics Concern  . None   Social History Narrative  . None   Family History  Problem Relation Age of Onset  . Anemia Sister   . Anemia Sister   . Diabetes Mother   . Heart disease Mother   . Hypertension Mother   . Diabetes Father   . Hypertension Father   . Scleroderma Father    No Known Allergies Prior to Admission medications   Medication Sig Start Date End Date Taking? Authorizing Provider  acetaminophen (TYLENOL) 500 MG tablet Take 1,000 mg by mouth every 6 (six) hours as needed. For pain.    Yes Historical Provider, MD  hydrochlorothiazide 25 MG tablet Take 1 tablet by mouth daily. 07/01/11  Yes Historical Provider, MD  HYDROmorphone (DILAUDID) 2 MG tablet Take 2 mg by mouth every 6 (six) hours as needed. For pain   Yes Historical Provider, MD  metoprolol tartrate (LOPRESSOR) 25 MG tablet Take 12.5 mg by mouth 2 (two) times daily.     Yes Historical Provider, MD  predniSONE (DELTASONE) 10 MG tablet 10 mg daily. Take as directed 01/07/12  Yes Barbaraann Share,  MD  terbinafine (LAMISIL) 250 MG tablet Take 250 mg by mouth daily.   Yes Historical Provider, MD   Ct Shoulder Left W Contrast  03/22/2012  *RADIOLOGY REPORT*  Clinical Data: Cellulitis or infection.  Fever.  CT OF THE LEFT SHOULDER WITH CONTRAST  Technique:  Multidetector CT imaging was performed following the standard protocol during bolus administration of intravenous contrast.  Contrast: OMNIPAQUE IOHEXOL 300 MG/ML  SOLN  Comparison: None.  Findings: I think there may be a small amount of fluid in the shoulder joint.  I wonder if there is some edema in the deltoid muscle, but that is difficult to state with certainty.  There is no evidence of fracture, degenerative disease of the joint or osteomyelitis.  I  cannot identify a rotator cuff tear, though sensitivity is limited.  AC joint appears unremarkable.  Other regional soft tissues appear unremarkable.  IMPRESSION: Question small amount of fluid in the shoulder joint.  Question edema in the deltoid muscle, particularly anteriorly.  These findings are debatable and nonspecific.  See above for full discussion.  Original Report Authenticated By: Thomasenia Sales, M.D.    Positive ROS: All other systems have been reviewed and were otherwise negative with the exception of those mentioned in the HPI and as above.  Physical Exam: General: Alert, no acute distress,  obese. Cardiovascular: No pedal edema Respiratory: No cyanosis, no use of accessory musculature GI: No organomegaly, abdomen is soft and non-tender Skin: No lesions in the area of chief complaint. She has an area that is circled in pen, which supposedly may have been red and swollen earlier today, although today he does not appear red, or warm. She does have some mild induration. Neurologic: Sensation intact distally Psychiatric: Patient is competent for consent with normal mood and affect Lymphatic: No axillary or cervical lymphadenopathy  MUSCULOSKELETAL: The left elbow has normal motion both actively and passively. All fingers flex extend and abduct. Her range of motion with the arm at the side is 0-20 of external rotation, but this does cause some pain around the shoulder. Active forward flexion is 0-10. Passively I can get her up to about 80. She does not have any warmth around the shoulder, and she does report some pain around the paraspinal musculature.  White blood cell count is slightly above 10,000, and she does not have any recent fevers.  Ultrasound of the left upper extremity demonstrates no evidence for DVT.  Assessment: Left shoulder pain, with some subcutaneous induration of the left proximal arm, questionable cellulitis versus septic shoulder versus inflammatory  granulomatous reaction secondary to sarcoidosis, which is well described, and can involve the skin, subcutaneous tissue, and can also involve a myositis.  Plan: This is an acute, severe presentation of shoulder and arm pain. I have a relatively low clinical suspicion of sepsis of either the shoulder or the arm, however a radiology guided aspiration of the left shoulder would be the best way to rule out a septic shoulder. I discussed the options with the patient and the family, and they would like to proceed with a aspiration of the shoulder. She seems to be clinically improving with the antibiotics and pain medications, although I am not sure which is the driving force. I would anticipate that her sedimentation rate and CRP would be elevated secondary to her sarcoidosis, regardless of the pathologic process in her arm. The CT scan is not particularly impressive. We are going to get the aspiration, and then make a more definitive  treatment plan, and we'll continue to observe clinically. She seems improved with the medical treatment already provided.    Eulas Post, MD 03/22/2012 9:52 PM

## 2012-03-22 NOTE — ED Notes (Signed)
Pt returned from ultrasound. Pt upset that she hasn't had her MRI yet. Called MRI and it will be about 30 minutes more before they are ready for her. They said they attempted to get her earlier but she was in [place]. This information was relayed to the pt.

## 2012-03-22 NOTE — ED Provider Notes (Signed)
Pt was seen and examined by me in CDU. Pt is a transfer from [facility] where she was seen earlier by Dr. Golda Acre for pain in the left upper arm. Pt sent here to ro cervical radiculopathy. Pt does report subjective fevers at home, reports being unable to move left shoulder or elbow joints.   Exam. Pt appears in pain. AAOx3. Lungs are clear to ausculatation bilaterally, regular hr and rhythm. Left arm is swollen, erythemous, indurated over the bicep area. Tender from shoulder joint, over the bicep into the elbow. Pain with any movement at shoulder joint, and elbow joint. Area is warm to the touch. No cervical tenderness. Full rom of the neck. Equal grip strength bilaterally.   Results for orders placed during the hospital encounter of 03/22/12  CBC      Component Value Range   WBC 10.7 (*) 4.0 - 10.5 (K/uL)   RBC 4.48  3.87 - 5.11 (MIL/uL)   Hemoglobin 12.7  12.0 - 15.0 (g/dL)   HCT 14.7  82.9 - 56.2 (%)   MCV 84.8  78.0 - 100.0 (fL)   MCH 28.3  26.0 - 34.0 (pg)   MCHC 33.4  30.0 - 36.0 (g/dL)   RDW 13.0 (*) 86.5 - 15.5 (%)   Platelets 346  150 - 400 (K/uL)  DIFFERENTIAL      Component Value Range   Neutrophils Relative 72  43 - 77 (%)   Neutro Abs 7.7  1.7 - 7.7 (K/uL)   Lymphocytes Relative 15  12 - 46 (%)   Lymphs Abs 1.6  0.7 - 4.0 (K/uL)   Monocytes Relative 9  3 - 12 (%)   Monocytes Absolute 0.9  0.1 - 1.0 (K/uL)   Eosinophils Relative 3  0 - 5 (%)   Eosinophils Absolute 0.4  0.0 - 0.7 (K/uL)   Basophils Relative 1  0 - 1 (%)   Basophils Absolute 0.1  0.0 - 0.1 (K/uL)  BASIC METABOLIC PANEL      Component Value Range   Sodium 137  135 - 145 (mEq/L)   Potassium 3.6  3.5 - 5.1 (mEq/L)   Chloride 100  96 - 112 (mEq/L)   CO2 26  19 - 32 (mEq/L)   Glucose, Bld 130 (*) 70 - 99 (mg/dL)   BUN 12  6 - 23 (mg/dL)   Creatinine, Ser 7.84  0.50 - 1.10 (mg/dL)   Calcium 69.6  8.4 - 10.5 (mg/dL)   GFR calc non Af Amer 86 (*) >90 (mL/min)   GFR calc Af Amer >90  >90 (mL/min)  SEDIMENTATION RATE       Component Value Range   Sed Rate 93 (*) 0 - 22 (mm/hr)  C-REACTIVE PROTEIN      Component Value Range   CRP 25.55 (*) <0.60 (mg/dL)   Pt has elevated WBC, elevated sed rate and crp, left upper arm appears to have cellulitis. Pt refused MRI due to severe anxiety, even with anxiety medications offered. I discussed pt with Dr. Clarene Duke, who agrees pt may benefit from admission for tx of cellulitis. It is concerning that she is unable to move her shoulder or elbow joints. Clindamycin ordered.  Spoke with [facility], will admit.        Lottie Mussel, PA 03/22/12 1521

## 2012-03-22 NOTE — ED Provider Notes (Signed)
History     CSN: [id number]  Arrival date & time 03/22/12  0746   First MD Initiated Contact with Patient 03/22/12 [phone]      Chief Complaint  Patient presents with  . Arm Pain    (Consider location/radiation/quality/duration/timing/severity/associated sxs/prior treatment) HPI Comments: Patient presents complaining of approximately one week of left arm pain and weakness.  She notes that last Wednesday she started developing left upper arm pain that radiates down.  She is noted a mild amount of redness on her upper arm but that seems to be improving.  She notes that she feels that the arm is swollen and that it's just very heavy. she's had significant pain as well.  She was in [place] on Friday and was seen in the emergency department there for this and discharge.  Patient notes that she lives in [place] region and is now here due to increased pain and unclear diagnosis.  Patient has not had prior neck surgeries or injuries.  No recent falls or injuries.  Patient denies numbness in the left arm.  She has no right upper extremity or lower extremity problems.  Patient does note a history of sarcoidosis and had previously been on chronic steroids but has been taking them intermittently recently.  Patient noted subjective fevers last Wednesday and Thursday which have resolved and she did not take her temperature at that time.  Patient is a 46 y.o. female presenting with arm pain. The history is provided by the patient and the spouse. No language interpreter was used.  Arm Pain This is a new problem. Pertinent negatives include no chest pain, no abdominal pain, no headaches and no shortness of breath.    Past Medical History  Diagnosis Date  . Hypertension   . Ovarian cyst   . Fibroid tumor   . PVC (premature ventricular contraction)   . Bifascicular block   . Sarcoidosis   . S/P appy     Past Surgical History  Procedure Date  . Ovarian cyst removal   . Tonsillectomy   . Appendectomy  1988  . Partial hysterectomy 1988    right  . Tubal ligation 1991  . Bladder surgery before age 10    bladder/kidney surgery to reconnect tubes.    Family History  Problem Relation Age of Onset  . Anemia Sister   . Anemia Sister   . Diabetes Mother   . Heart disease Mother   . Hypertension Mother   . Diabetes Father   . Hypertension Father   . Scleroderma Father     History  Substance Use Topics  . Smoking status: Never Smoker   . Smokeless tobacco: Never Used  . Alcohol Use: No    OB History    Grav Para Term Preterm Abortions TAB SAB Ect Mult Living                  Review of Systems  Constitutional: Negative for fever and chills.  HENT: Negative.   Eyes: Negative.  Negative for discharge and redness.  Respiratory: Negative.  Negative for cough and shortness of breath.   Cardiovascular: Negative.  Negative for chest pain.  Gastrointestinal: Negative.  Negative for nausea, vomiting, abdominal pain and diarrhea.  Genitourinary: Negative.  Negative for dysuria and vaginal discharge.  Musculoskeletal: Negative for back pain.  Skin: Negative.  Negative for color change and rash.  Neurological: Positive for weakness. Negative for syncope and headaches.  Hematological: Negative.  Negative for adenopathy.  Psychiatric/Behavioral: Negative.  Negative for confusion.  All other systems reviewed and are negative.    Allergies  Review of patient's allergies indicates no known allergies.  Home Medications   Current Outpatient Rx  Name Route Sig Dispense Refill  . ACETAMINOPHEN 500 MG PO TABS Oral Take 1,000 mg by mouth every 6 (six) hours as needed. For pain.     Marland Kitchen HYDROCHLOROTHIAZIDE 25 MG PO TABS Oral Take 1 tablet by mouth daily.    Marland Kitchen METOPROLOL TARTRATE 25 MG PO TABS Oral Take 12.5 mg by mouth 2 (two) times daily.      Marland Kitchen NAPROXEN SODIUM 220 MG PO TABS Oral Take 220 mg by mouth as needed.     Marland Kitchen PREDNISONE 10 MG PO TABS  Take as directed 50 tablet 1  . TERBINAFINE  HCL 250 MG PO TABS Oral Take 250 mg by mouth daily.      BP 162/98  Pulse 95  Temp(Src) 98.4 F (36.9 C) (Oral)  Resp 18  Ht 5\' 11"  (1.803 m)  Wt 305 lb (138.347 kg)  BMI 42.54 kg/m2  SpO2 100%  Physical Exam  Nursing note and vitals reviewed. Constitutional: She is oriented to person, place, and time. She appears well-developed and well-nourished.  Non-toxic appearance. She does not have a sickly appearance.  HENT:  Head: Normocephalic and atraumatic.  Eyes: Conjunctivae, EOM and lids are normal. Pupils are equal, round, and reactive to light. No scleral icterus.  Neck: Trachea normal and normal range of motion. Neck supple.  Cardiovascular: Normal rate, regular rhythm and normal heart sounds.   Pulmonary/Chest: Effort normal and breath sounds normal. No respiratory distress. She has no wheezes. She has no rales.  Abdominal: Soft. Normal appearance. There is no tenderness. There is no rebound, no guarding and no CVA tenderness.  Musculoskeletal: She exhibits no edema and no tenderness.       Patient's left arm appears symmetric to the right arm and without swelling.  There is a mild area of redness on the anterior medial upper arm.  No crepitus or fluctuance.  Patient resists even passive range of motion to abducting her left arm.  She states that sensation to light touch is symmetric between both arms.  There is no C-spine tenderness or step-offs on exam.  Patient has mild tenderness in her C-spine paraspinal muscles.  Patient can move all of her fingers.  Palpable radial pulse in her left arm with capillary refill less than 2 seconds in her fingertips.  Neurological: She is alert and oriented to person, place, and time. She has normal strength.  Skin: Skin is warm, dry and intact. No rash noted.  Psychiatric: She has a normal mood and affect. Her behavior is normal. Judgment and thought content normal.    ED Course  Procedures (including critical care time)   Labs Reviewed  CBC    DIFFERENTIAL  BASIC METABOLIC PANEL  SEDIMENTATION RATE  C-REACTIVE PROTEIN   No results found.   No diagnosis found.    MDM  Patient with left arm pain and weakness concerning for possible cervical radiculopathy.  I am unable to obtain MRI here at [facility] so I'm going to transfer the patient to the [facility] emergency department CDU to obtain her C-spine MRI.  This was discussed with Dr. Clarene Duke and Sherlene Shams, PA for transfer and plan.  Given patient's subjective fevers I have sent laboratory studies along with an ESR and CRP for further evaluation.  Patient will be given pain  medications here and her husband will drive her over by private vehicle.        Nat Christen, MD 03/22/12 [phone]

## 2012-03-22 NOTE — ED Notes (Signed)
Pt reports left shoulder and arm pain that started Wednesday.  She was seen in [facility] Friday for similar symptoms and released.

## 2012-03-22 NOTE — ED Notes (Signed)
Pt is asking for pain medication 7/10 and for bp medication.

## 2012-03-22 NOTE — ED Notes (Signed)
Called pharmacy about HCTZ and they are sending it now.

## 2012-03-22 NOTE — H&P (Signed)
Hospital Admission Note Date: 03/22/2012  Patient name: [name] Medical record number: [id number] Date of birth: 01/16/1966 Age: 46 y.o. Gender: female PCP: Levander Campion, MD, MD  Medical Service: Internal Medicine Teaching Service  Attending physician:  Dr. Meredith Pel    1st Contact: MS4 Prince Rome   Pager: [phone] 2nd Contact: Dr. Carrolyn Meiers   Pager: [phone] After 5 pm or weekends: 1st Contact:      Pager: [phone] 2nd Contact:      Pager: [phone]  Chief Complaint: left upper extremity pain  History of Present Illness: [name] is a 46 year old woman with a history of sarcoidosis, HTN presenting with a painful left upper extremity.  The pain began suddenly 7 days ago (03/15/12) as a throbbing heaviness involving the upper arm, with associated achy pain involving the middle of her back and shoulder blades.  She denies any trauma/injury.   The pain was intermittent at first and then became continuous, eventually spreading distally to involve her entire arm.  The pain at times feels sharp anteriorly along her arm.  She describes a swelling sensation of her arm and weakness secondary to pain.  She also noted subjective fevers and chills over the first 2 days.  5 days ago (03/17/12) [name] presented to [facility] and had radiographs and ultrasound performed.  Imaging demonstrated normal humerus, some calcific tendonitis of the left rotator cuff and some undersurface osteophytes at the acromion process (a source of possible impingment), and no left upper extremity clot.  She was given hydromorphone for shoulder pain and empiric ciprofloxacin for UTI.  She was instructed to return to the hospital after a few days if the pain persisted or got worse for further evaluation.  Thus, [name] comes today with persistent left upper extremity pain and a return of her UTI symptoms. Of note, she has been on Prednisone 10mg  daily for her sarcoidosis and recently started to have joint pain so Dr.  Shelle Iron with Pulmonology referred patient to rheumatology but she has not been evaluated by Rheumatology yet.  Meds: Medications Prior to Admission  Medication Dose Route Frequency Provider Last Rate Last Dose  . clindamycin (CLEOCIN) IVPB 600 mg  600 mg Intravenous Once Tatyana A Kirichenko, PA   600 mg at 03/22/12 1052  . clindamycin (CLEOCIN) IVPB 600 mg  600 mg Intravenous Q8H Farley Ly, MD      . heparin injection 5,000 Units  5,000 Units Intravenous TID Mathis Dad, MD      . hydrochlorothiazide (HYDRODIURIL) tablet 25 mg  25 mg Oral Daily Tatyana A Kirichenko, PA      . hydrochlorothiazide (HYDRODIURIL) tablet 25 mg  25 mg Oral Once Laray Anger, DO   25 mg at 03/22/12 1500  . HYDROmorphone (DILAUDID) injection 1 mg  1 mg Intravenous Once Nat Christen, MD   1 mg at 03/22/12 0837  . ketorolac (TORADOL) 30 MG/ML injection 30 mg  30 mg Intravenous Once Nat Christen, MD   30 mg at 03/22/12 0837  . methylPREDNISolone sodium succinate (SOLU-MEDROL) 125 mg/2 mL injection 60 mg  60 mg Intravenous Q12H Mathis Dad, MD   60 mg at 03/22/12 1729  . metoprolol tartrate (LOPRESSOR) tablet 12.5 mg  12.5 mg Oral BID Tatyana A Kirichenko, PA   12.5 mg at 03/22/12 1730  . morphine 2 MG/ML injection        2 mg at 03/22/12 1424  . morphine 4 MG/ML injection 6 mg  6 mg Intravenous Once Tatyana A Kirichenko, PA   4 mg at 03/22/12 1419  . oxyCODONE-acetaminophen (PERCOCET) 5-325 MG per tablet 1 tablet  1 tablet Oral Q4H PRN Mathis Dad, MD   1 tablet at 03/22/12 1730  . DISCONTD: clindamycin (CLEOCIN) IVPB 600 mg  600 mg Intravenous Q8H Mathis Dad, MD      . DISCONTD: HYDROmorphone (DILAUDID) tablet 2 mg  2 mg Oral Q6H PRN Mathis Dad, MD   2 mg at 03/22/12 1612  . DISCONTD: predniSONE (DELTASONE) tablet 10 mg  10 mg Oral Daily Mathis Dad, MD   10 mg at 03/22/12 1611   Medications Prior to Admission  Medication Sig Dispense Refill  . acetaminophen (TYLENOL) 500 MG tablet Take  1,000 mg by mouth every 6 (six) hours as needed. For pain.       . hydrochlorothiazide 25 MG tablet Take 1 tablet by mouth daily.      . metoprolol tartrate (LOPRESSOR) 25 MG tablet Take 12.5 mg by mouth 2 (two) times daily.        . predniSONE (DELTASONE) 10 MG tablet 10 mg daily. Take as directed      . terbinafine (LAMISIL) 250 MG tablet Take 250 mg by mouth daily.        Allergies: Allergies as of 03/22/2012  . (No Known Allergies)   Past Medical History  Diagnosis Date  . Hypertension   . Ovarian cyst   . Fibroid tumor   . Sarcoidosis   . Complication of anesthesia     "felt like it was difficult to come out; very nauseated"  . PONV (postoperative nausea and vomiting)   . Angina   . PVC (premature ventricular contraction)   . Bifascicular block   . Arrhythmia 03/22/12    "I have an irregular heartbeat all the time"   Past Surgical History  Procedure Date  . Ovarian cyst removal 1986    "included right tube removed"  . Tubal ligation 1991  . Bladder surgery 1966    bladder/kidney surgery to reconnect tubes.  . Uterine fibroid surgery     "three since 1986"  . Tonsillectomy and adenoidectomy 1984  . Right oophorectomy 1986  . Appendectomy 1988   Family History  Problem Relation Age of Onset  . Anemia Sister   . Anemia Sister   . Diabetes Mother   . Heart disease Mother   . Hypertension Mother   . Diabetes Father   . Hypertension Father   . Scleroderma Father    History   Social History  . Marital Status: Legally Separated    Spouse Name: N/A    Number of Children: 1  . Years of Education: N/A   Occupational History  . education recruiter    Social History Main Topics  . Smoking status: Never Smoker   . Smokeless tobacco: Never Used  . Alcohol Use: No  . Drug Use: No  . Sexually Active: Not Currently   Other Topics Concern  . Not on file   Social History Narrative  . No narrative on file    Review of Systems: Constitutional: positive for  headache, chills & subjective fevers, denies weight loss or night sweats Eyes: denies vision changes or diplopia Respiratory: denies SOB Cardiovascular: denies chest pain Gastrointestinal: denies blood in stool or abdominal pain Genitourinary: denies blood in urine Musculoskeletal: denies other achy joints Neurological: denies weakness that is not secondary to pain or unusual sensations other than left  arm  Physical Exam: Blood pressure 167/112, pulse 90, temperature 98.6 F (37 C), temperature source Oral, resp. rate 20, last menstrual period 10/14/2011, SpO2 100.00%. - General - alert and oriented x3, lying comfortably on bed, NAD -HEENT - atraumatic, mucous membranes moist. Not able to examine for adenopathy of axilla because patient unable to raise her arms -Neck: no thyromegaly, no JVD - Cardiac - rhythm somewhat irregular, normal rate, no murmurs, rubs, or gallops - Lung - CTAB, no crackles or wheezes - Abdomen - normal bowel sounds, no masses or tenderness to palpation, soft, non-distended - Neuro - left upper extremity difficult to assess as weakness was admittedly secondary to pain, hand movements however revealed intact finger extension, thumb/index opposition, and digit abduction/adduction; otherwise CNs and motor grossly intact - Skin - 8x5 cm slightly indurated and warm erythema located on the medial/anterior aspect of upper left arm near axilla, very tender to palpation, blanches slightly with pressure, axillary nodes not able to palpated - Pulses - +2 pulses x4 extremities  Lab results: Basic Metabolic Panel:  Basename 03/22/12 0830  NA 137  K 3.6  CL 100  CO2 26  GLUCOSE 130*  BUN 12  CREATININE 0.80  CALCIUM 10.1  MG --  PHOS --   CBC:  Basename 03/22/12 0830  WBC 10.7*  NEUTROABS 7.7  HGB 12.7  HCT 38.0  MCV 84.8  PLT 346    Assessment & Plan by Problem: 1) Left shoulder/arm pain: Differential diagnosis includes cellulitis versus rotator cuff  tendonitis vs. joint infection.  Her presenting symptoms is concerning for joint infection given her very limited ROM and her elevated ESR & sed rate .   - We ordered MRI of left shoulder but patient not able to fit into machine.  -Consulted Orthopedics for further evaluation -Will get plain Xray of left shoulder - Continue empiric Clindamycin IV 600 mg q 8 hours for cellulitis - Solumedrol 60 mg bid for inflammation - Percocet 5 mg q 4 hours PRN for pain  2) Dysuria: Patient was recently treated with Cipro for dysuria x 5 days.    - Will get urinalysis and urine culture and will treat appropriately.  3) Sarcoidosis: CRP 25.55, ESR 93.  Denies shortness of breath.  Per patient, her cardiac dysrhythmia are due to sarcoid.  On prednisone 10 mg at home, admits to often missing doses. - Continue solumedrol, higher dose (60 mg) given to also help relieve inflammation  4) HTN: Currently BP 167/112.  Likely largely due to pain. - Continue metoprolol 12.5 mg q day and HCTZ 25 mg q day  DVT ppx: Heparin SQ 5000u  Signed: Demica Zook 03/22/2012, 5:40 PM

## 2012-03-23 ENCOUNTER — Other Ambulatory Visit (HOSPITAL_COMMUNITY): Payer: Managed Care, Other (non HMO)

## 2012-03-23 ENCOUNTER — Inpatient Hospital Stay (HOSPITAL_COMMUNITY): Payer: Managed Care, Other (non HMO)

## 2012-03-23 ENCOUNTER — Encounter (HOSPITAL_COMMUNITY): Payer: Self-pay | Admitting: Orthopedic Surgery

## 2012-03-23 DIAGNOSIS — L03114 Cellulitis of left upper limb: Secondary | ICD-10-CM

## 2012-03-23 HISTORY — DX: Cellulitis of left upper limb: L03.114

## 2012-03-23 LAB — GLUCOSE, CAPILLARY

## 2012-03-23 LAB — BASIC METABOLIC PANEL
Calcium: 10.3 mg/dL (ref 8.4–10.5)
Chloride: 99 mEq/L (ref 96–112)
Creatinine, Ser: 0.74 mg/dL (ref 0.50–1.10)
GFR calc Af Amer: >90 mL/min (ref >90)

## 2012-03-23 LAB — SYNOVIAL CELL COUNT + DIFF, W/ CRYSTALS
Crystals, Fluid: NONE SEEN
Monocyte-Macrophage-Synovial Fluid: 1 % — ABNORMAL LOW (ref 50–90)
WBC, Synovial: 65800 /mm3 — ABNORMAL HIGH (ref 0–200)

## 2012-03-23 LAB — URINALYSIS, ROUTINE W REFLEX MICROSCOPIC
Nitrite: NEGATIVE
Specific Gravity, Urine: 1.027 (ref 1.005–1.030)
Urobilinogen, UA: 1 mg/dL (ref 0.0–1.0)

## 2012-03-23 LAB — CK TOTAL AND CKMB (NOT AT ARMC): Relative Index: 1 (ref 0.0–2.5)

## 2012-03-23 LAB — URINE MICROSCOPIC-ADD ON

## 2012-03-23 LAB — CBC
Platelets: 391 10*3/uL (ref 150–400)
RDW: 15.4 % (ref 11.5–15.5)
WBC: 10.9 10*3/uL — ABNORMAL HIGH (ref 4.0–10.5)

## 2012-03-23 MED ORDER — INSULIN ASPART 100 UNIT/ML ~~LOC~~ SOLN: 0.0000 [IU] | SUBCUTANEOUS | Status: DC

## 2012-03-23 MED ORDER — WHITE PETROLATUM GEL
Status: AC
  Filled 2012-03-23: qty 5

## 2012-03-23 MED ORDER — IOHEXOL 300 MG/ML  SOLN
50.0000 mL | Freq: Once | INTRAMUSCULAR | Status: AC | PRN
  Administered 2012-03-23: 20 mL via INTRA_ARTICULAR

## 2012-03-23 MED ORDER — INSULIN ASPART 100 UNIT/ML ~~LOC~~ SOLN
0.0000 [IU] | SUBCUTANEOUS | Status: AC
  Administered 2012-03-23: 8 [IU] via SUBCUTANEOUS
  Administered 2012-03-23: 5 [IU] via SUBCUTANEOUS

## 2012-03-23 NOTE — H&P (Signed)
Agree 

## 2012-03-23 NOTE — Procedures (Signed)
  Clinical Data: [Shoulder joint effusion.  Possible septic joint.  Abnormal fluid and edema along the pectoralis and deltoid musculature anteriorly.] FLUORO GUIDED left shoulder arthrocentesis  Comparison: [CT scan dated 03/22/2012] Procedure:  I discussed the risks (including hemorrhage and introducing infection into the glenohumeral joint, among others), benefits, and alternatives to fluoroscopically guided left shoulder arthrocentesis with the patient.  We discussed the good likelihood of technical success of the procedure.  She understood and elected to undergo the procedure. Because of the findings in the anterior musculature on yesterday's CT scan, I elected for a posterior approach to the glenohumeral joint in order to avoid contaminating the joint with any of the anterior fluid. Following sterile skin prep and local anesthetic administration consisting of 1% lidocaine, a 6 inches 20 gauge needle was advanced into the upper portion of the left glenohumeral joint from a posterior approach.  Omnipaque-300 contrast was injected into the joint and demonstrated irregular joint contour suggesting synovitis.  Initial attempts to aspirate from the joint were unsuccessful, possibly due to blocking of the needle tip by the inflamed synovium.  After repositioning, I aspirated 11 ml of cloudy yellow fluid from the glenohumeral joint.  The needle was subsequently removed and the skin cleansed and bandaged. The syringe containing the aspirated fluid was labeled and sent to the lab.  No immediate complications were observed. IMPRESSION: [ 1.  Technically successful fluoroscopically guided left glenohumeral joint aspiration, from posterior approach.  A total of 11 ml of cloudy yellow fluid was withdrawn from the glenohumeral joint.]

## 2012-03-23 NOTE — ED Provider Notes (Signed)
Medical screening examination/treatment/procedure(s) were performed by non-physician practitioner and as supervising physician I was immediately available for consultation/collaboration.   Laray Anger, DO 03/23/12 [phone]

## 2012-03-23 NOTE — Progress Notes (Signed)
Pt. c/o a lot of pain in her Left  arm.  Nurse called Dr. Vania Rea.  She ordered K-pad and said it is ok to give next percocet dose now.  Charise Carwin, RN.

## 2012-03-23 NOTE — Discharge Instructions (Signed)
It is very important to keep your follow-up appointments especially with Rheumatology or call to re-schedule in advance.

## 2012-03-23 NOTE — Progress Notes (Signed)
Medical Student Daily Progress Note  Subjective: No acute events overnight.  Pain better controlled but still too painful to move arm.  Objective: Vital signs in last 24 hours: Filed Vitals:   03/22/12 1401 03/22/12 1801 03/22/12 2052 03/23/12 0625  BP: 167/112 152/99 128/78 135/83  Pulse: 90 95 95 81  Temp: 98.6 F (37 C) 100.4 F (38 C) 97.9 F (36.6 C) 97.8 F (36.6 C)  TempSrc: Oral Oral Oral Oral  Resp: 20 20 20 20   SpO2: 100% 97% 98% 92%   Intake/Output Summary (Last 24 hours) at 03/23/12 0919 Last data filed at 03/23/12 0500  Gross per 24 hour  Intake      0 ml  Output    800 ml  Net   -800 ml   Physical Exam: - General - alert and oriented x3, lying comfortably on bed, NAD  -HEENT - atraumatic, mucous membranes moist. Not able to examine for adenopathy of axilla because patient unable to raise her arms  -Neck: no thyromegaly, no JVD  - Cardiac - rhythm somewhat irregular, normal rate, no murmurs, rubs, or gallops  - Lung - CTAB, no crackles or wheezes  - Abdomen - normal bowel sounds, no masses or tenderness to palpation, soft, non-distended  - Neuro - left upper extremity difficult to assess as weakness was admittedly secondary to pain, hand movements however revealed intact finger extension, thumb/index opposition, and digit abduction/adduction; otherwise CNs and motor grossly intact  - Skin - borders of indurated and warm erythema located on the medial/anterior aspect of upper left arm near axilla with interval recession within 8x5 inch pen-drawn demarcated area, very tender to palpation, blanches slightly with pressure, axillary nodes not able to palpated  - Pulses - +2 pulses x4 extremities  Lab Results: Basic Metabolic Panel:  Lab 03/23/12 1610 03/22/12 0830  NA 135 137  K 4.3 3.6  CL 99 100  CO2 23 26  GLUCOSE 249* 130*  BUN 14 12  CREATININE 0.74 0.80  CALCIUM 10.3 10.1  MG -- --  PHOS -- --   CBC:  Lab 03/23/12 0507 03/22/12 0830  WBC 10.9*  10.7*  NEUTROABS -- 7.7  HGB 12.3 12.7  HCT 36.4 38.0  MCV 84.5 84.8  PLT 391 346   Urinalysis:  Lab 03/22/12 1801  COLORURINE AMBER*  LABSPEC 1.033*  PHURINE 5.5  GLUCOSEU NEGATIVE  HGBUR TRACE*  BILIRUBINUR SMALL*  KETONESUR 15*  PROTEINUR 100*  UROBILINOGEN 1.0  NITRITE NEGATIVE  LEUKOCYTESUR SMALL*   Misc. Labs: ESR 93, CRP 25.55  Studies/Results: Ct Shoulder Left W Contrast  03/22/2012  *RADIOLOGY REPORT*  Clinical Data: Cellulitis or infection.  Fever.  CT OF THE LEFT SHOULDER WITH CONTRAST  Technique:  Multidetector CT imaging was performed following the standard protocol during bolus administration of intravenous contrast.  Contrast: OMNIPAQUE IOHEXOL 300 MG/ML  SOLN  Comparison: None.  Findings: I think there may be a small amount of fluid in the shoulder joint.  I wonder if there is some edema in the deltoid muscle, but that is difficult to state with certainty.  There is no evidence of fracture, degenerative disease of the joint or osteomyelitis.  I cannot identify a rotator cuff tear, though sensitivity is limited.  AC joint appears unremarkable.  Other regional soft tissues appear unremarkable.  IMPRESSION: Question small amount of fluid in the shoulder joint.  Question edema in the deltoid muscle, particularly anteriorly.  These findings are debatable and nonspecific.  See above for full  discussion.  Original Report Authenticated By: Thomasenia Sales, M.D.   Medications: I have reviewed the patient's current medications. Scheduled Meds:   . clindamycin (CLEOCIN) IV  600 mg Intravenous Once  . clindamycin (CLEOCIN) IV  600 mg Intravenous Q8H  . heparin  5,000 Units Subcutaneous Q8H  . hydrochlorothiazide  25 mg Oral Daily  . hydrochlorothiazide  25 mg Oral Once  . methylPREDNISolone (SOLU-MEDROL) injection  60 mg Intravenous Q12H  . metoprolol tartrate  12.5 mg Oral BID  . morphine      . morphine  6 mg Intravenous Once  . white petrolatum      . DISCONTD:  clindamycin (CLEOCIN) IV  600 mg Intravenous Q8H  . DISCONTD: diazepam  5 mg Oral Once  . DISCONTD: heparin  5,000 Units Intravenous TID  . DISCONTD: heparin subcutaneous  5,000 Units Subcutaneous Q8H  . DISCONTD: predniSONE  10 mg Oral Daily   PRN Meds:.iohexol, ondansetron (ZOFRAN) IV, ondansetron, oxyCODONE-acetaminophen, DISCONTD: HYDROmorphone  Assessment/Plan: 1) Left shoulder/arm pain: Less pain and swelling today.  Differential diagnosis includes cellulitis versus rotator cuff tendonitis vs. joint infection vs. Inflammatory granulomatous reaction secondary to sarcoid. Her presenting symptoms is concerning for joint infection given her very limited ROM and her elevated ESR & sed rate.  Tmax last night at 6pm of 100.4, WBC currently 10.9.  Ordered shoulder MRI but patient was too wide to fit into the machine.  Shoulder CT revealed possible small amount of fluid collection in shoulder joint and possible edema in anterior deltoid, interpreted as debatable and non-specific findings.  LUE ultrasound demonstrated no thrombus.  Orthopedics consult suggested radiology-guided aspiration of shoulder joint with synovial fluid analysis. - Continue empiric Clindamycin IV 600 mg q 8 hours for cellulitis  - Solumedrol 60 mg bid for inflammation  - Percocet 5 mg q 4 hours PRN for pain  - Follow up results of radiology-guided aspiration of shoulder joint with synovial fluid analysis  2) Dysuria: Return of symptoms following recent empiric treatment with Cipro for dysuria x 5 days.  Urinalysis (03/22/12) showed small LE, neg nitrates, 100 protein, 7-10 WBCs, but did have many squamous epithelial cells, suggestive of contaminated sample.  Combination of acute swollen joint and dysuria raises suspicion for STD versus UTI. - Will get repeat urinalysis and urine culture (this time clean catch sample) and will treat appropriately - Added on GC-Ch to   3) Sarcoidosis: CRP 25.55, ESR 93 (was 19 five months ago).  Denies shortness of breath, has no crackles on exam. Per patient, her cardiac dysrhythmia are due to sarcoid. Sees pulmonologist Dr. Shelle Iron, on prednisone 10 mg at home, admits to often missing doses.  Has had recent complaints of increased generalized joint pains and was referred to rheumatologist, but has not yet made it to an appointment.  Blood glucose is acutely elevated (249 today) likely secondary to raised dose therapeutic steroids. - Continue solumedrol, higher dose (60 mg) given to also help relieve inflammation  - ANA & RF to evaluate if joint pain might be related to etiology other than sarcoid - Sliding scale insulin  4) HTN: BP 167/112 on admission likely largely due to pain.  Last 12-lead EKG on 07/15/11 showed bifascicular block and PVCs, last dobutamine stress ECHO on 07/29/11 showed showed normal LV function and no evidence of ischemia.  Currently 135/83 with better pain control. - Continue metoprolol 12.5 mg q day and HCTZ 25 mg q day  - Follow up 12-lead EKG  DVT ppx: Heparin  SQ 5000u    LOS: 1 day   This is a Psychologist, occupational Note.  The care of the patient was discussed with Dr. Meredith Pel and the assessment and plan formulated with their assistance.  Please see their attached note for official documentation of the daily encounter.  Aleene Davidson 03/23/2012, 9:19 AM  Resident Addendum to Medical Student Note   I have seen and examined the patient, and agree with the the medical student assessment and plan outlined above. Please see my brief note below for additional details.  S: Pts mother and son at bedside, Reports feeling better and aware of plan of care for today which includes IR procedure and evaluation with labs   OBJECTIVE: VS: Reviewed  Meds: Reviewed  Labs: Reviewed  Imaging: Reviewed   Physical Exam: General: AA female, obese, well-developed, in no acute distress; alert, appropriate and cooperative throughout examination.  HEENT: Normocephalic,  atraumatic  Lungs:  Normal respiratory effort. Clear to auscultation BL without crackles or wheezes.  Heart: RRR. S1 and S2 normal without gallop, murmur, or rubs appreciated.  Abdomen:  Obese, BS normoactive. Soft, non-tender  Extremities: Left upper arm with marked improvement in erythema and tenderness to palpation, pt with continued decreased ROM at shoulder joint     ASSESSMENT/ PLAN: Pt is a 46 y.o. yo female with a PMHx of sarcoidoisis who was admitted on 03/22/2012 with symptoms of left upper arm and shoulder pain associated with induration.  1) Left shoulder/arm pain: CT and XRay of left shoulder unremarkable for source of presenting complaints, Ortho following with recommendation for IR aspiration of shoulder joint and any fluid collections as well possible biopsy of cellulitic area.  This will assist with determination of likely granulomatous reaction from sarcoidosis.   --IR aspiration today will send fluid for cell count w/ diff, crystals and culture. --check Rheum Factor and ANA  2) Dysuria: First sample not clean catch with many squam epith - repeat urinalysis with appropriate antibx therapy to follow --check urine GC/Chlam  3) Sarcoidosis: CRP 25.55, ESR 93. Home regimen of prednisone 10 mg daily. - Continue solumedrol, higher dose (60 mg) with plans to taper before discharge --EKG consistent with pt report and EKG at outside faciliytdemonstrating RBBB and left anterior fasc block  4) HTN: Currently controlled with BP 135/82 pulse 81 bpm.  - Continue metoprolol 12.5 mg q day and HCTZ 25 mg q day   5) DVT ppx: will Heparin SQ 5000u for procedure  6) Disposition: --f/u PCP Dr. Tomma Lightning of [facility]  04/03/12 --f/u Rheumatology Dr. Dierdre Forth 03/28/12  Length of Stay: 1   Kristie Cowman, MD PGYI, Internal Medicine Resident 03/23/2012, 12:44 PM

## 2012-03-23 NOTE — H&P (Signed)
[name] is an 46 y.o. female.   Chief Complaint: Left shoulder pain with redness, warmth and induration at site Hx of sarcoidosis; htn HPI: scheduled for Left shoulder aspiration 4/11 or 4/12 in diagnostic radiology And Left shoulder US aspiration- if fluid noted; or tissue biopsy 4/12  Past Medical History  Diagnosis Date  . Hypertension   . Ovarian cyst   . Fibroid tumor   . Sarcoidosis   . Complication of anesthesia     "felt like it was difficult to come out; very nauseated"  . PONV (postoperative nausea and vomiting)   . Angina   . PVC (premature ventricular contraction)   . Bifascicular block   . Arrhythmia 03/22/12    "I have an irregular heartbeat all the time"  . Left arm cellulitis, question sarcoidosis inflammatory granulomatous reaction 03/23/2012    Past Surgical History  Procedure Date  . Ovarian cyst removal 1986    "included right tube removed"  . Tubal ligation 1991  . Bladder surgery 1966    bladder/kidney surgery to reconnect tubes.  . Uterine fibroid surgery     "three since 1986"  . Tonsillectomy and adenoidectomy 1984  . Right oophorectomy 1986  . Appendectomy 1988    Family History  Problem Relation Age of Onset  . Anemia Sister   . Anemia Sister   . Diabetes Mother   . Heart disease Mother   . Hypertension Mother   . Diabetes Father   . Hypertension Father   . Scleroderma Father    Social History:  reports that she has never smoked. She has never used smokeless tobacco. She reports that she does not drink alcohol or use illicit drugs.  Allergies: No Known Allergies  Medications Prior to Admission  Medication Dose Route Frequency Provider Last Rate Last Dose  . clindamycin (CLEOCIN) IVPB 600 mg  600 mg Intravenous Once Tatyana A Kirichenko, PA   600 mg at 03/22/12 1052  . clindamycin (CLEOCIN) IVPB 600 mg  600 mg Intravenous Q8H Farley Ly, MD   600 mg at 03/23/12 1137  . heparin injection 5,000 Units  5,000 Units  Subcutaneous Q8H Mathis Dad, MD   5,000 Units at 03/23/12 0533  . hydrochlorothiazide (HYDRODIURIL) tablet 25 mg  25 mg Oral Daily Tatyana A Kirichenko, PA   25 mg at 03/23/12 1019  . hydrochlorothiazide (HYDRODIURIL) tablet 25 mg  25 mg Oral Once Laray Anger, DO   25 mg at 03/22/12 1500  . HYDROmorphone (DILAUDID) injection 1 mg  1 mg Intravenous Once Nat Christen, MD   1 mg at 03/22/12 0837  . insulin aspart (novoLOG) injection 0-24 Units  0-24 Units Subcutaneous Q4H Manuela Schwartz, MD   5 Units at 03/23/12 1234  . iohexol (OMNIPAQUE) 300 MG/ML solution 100 mL  100 mL Intravenous Once PRN Medication Radiologist, MD   100 mL at 03/22/12 2011  . ketorolac (TORADOL) 30 MG/ML injection 30 mg  30 mg Intravenous Once Nat Christen, MD   30 mg at 03/22/12 0837  . methylPREDNISolone sodium succinate (SOLU-MEDROL) 125 mg/2 mL injection 60 mg  60 mg Intravenous Q12H Mathis Dad, MD   60 mg at 03/23/12 0701  . metoprolol tartrate (LOPRESSOR) tablet 12.5 mg  12.5 mg Oral BID Tatyana A Kirichenko, PA   12.5 mg at 03/23/12 1019  . morphine 2 MG/ML injection        2 mg at 03/22/12 1424  . morphine 4 MG/ML injection  6 mg  6 mg Intravenous Once Tatyana A Kirichenko, PA   4 mg at 03/22/12 1419  . ondansetron (ZOFRAN) tablet 4 mg  4 mg Oral Q6H PRN Mathis Dad, MD       Or  . ondansetron [facility]) injection 4 mg  4 mg Intravenous Q6H PRN Mathis Dad, MD      . oxyCODONE-acetaminophen (PERCOCET) 5-325 MG per tablet 1 tablet  1 tablet Oral Q4H PRN Mathis Dad, MD   1 tablet at 03/23/12 1610  . white petrolatum (VASELINE) gel           . DISCONTD: clindamycin (CLEOCIN) IVPB 600 mg  600 mg Intravenous Q8H Mathis Dad, MD      . DISCONTD: diazepam (VALIUM) tablet 5 mg  5 mg Oral Once Manuela Schwartz, MD      . DISCONTD: heparin injection 5,000 Units  5,000 Units Intravenous TID Mathis Dad, MD      . DISCONTD: heparin injection 5,000 Units  5,000 Units Subcutaneous Q8H Farley Ly, MD      . DISCONTD: HYDROmorphone (DILAUDID) tablet 2 mg  2 mg Oral Q6H PRN Mathis Dad, MD   2 mg at 03/22/12 1612  . DISCONTD: insulin aspart (novoLOG) injection 0-24 Units  0-24 Units Subcutaneous Q2H Manuela Schwartz, MD      . DISCONTD: insulin aspart (novoLOG) injection 0-24 Units  0-24 Units Subcutaneous Q4H Manuela Schwartz, MD      . DISCONTD: predniSONE (DELTASONE) tablet 10 mg  10 mg Oral Daily Mathis Dad, MD   10 mg at 03/22/12 1611   Medications Prior to Admission  Medication Sig Dispense Refill  . acetaminophen (TYLENOL) 500 MG tablet Take 1,000 mg by mouth every 6 (six) hours as needed. For pain.       . hydrochlorothiazide 25 MG tablet Take 1 tablet by mouth daily.      . metoprolol tartrate (LOPRESSOR) 25 MG tablet Take 12.5 mg by mouth 2 (two) times daily.        . predniSONE (DELTASONE) 10 MG tablet 10 mg daily. Take as directed      . terbinafine (LAMISIL) 250 MG tablet Take 250 mg by mouth daily.        Results for orders placed during the hospital encounter of 03/22/12 (from the past 48 hour(s))  URINALYSIS, ROUTINE W REFLEX MICROSCOPIC     Status: Abnormal   Collection Time   03/22/12  6:01 PM      Component Value Range Comment   Color, Urine AMBER (*) YELLOW  BIOCHEMICALS MAY BE AFFECTED BY COLOR   APPearance HAZY (*) CLEAR     Specific Gravity, Urine 1.033 (*) 1.005 - 1.030     pH 5.5  5.0 - 8.0     Glucose, UA NEGATIVE  NEGATIVE (mg/dL)    Hgb urine dipstick TRACE (*) NEGATIVE     Bilirubin Urine SMALL (*) NEGATIVE     Ketones, ur 15 (*) NEGATIVE (mg/dL)    Protein, ur 960 (*) NEGATIVE (mg/dL)    Urobilinogen, UA 1.0  0.0 - 1.0 (mg/dL)    Nitrite NEGATIVE  NEGATIVE     Leukocytes, UA SMALL (*) NEGATIVE    URINE MICROSCOPIC-ADD ON     Status: Abnormal   Collection Time   03/22/12  6:01 PM      Component Value Range Comment   Squamous Epithelial / LPF MANY (*) RARE     WBC,  UA 7-10  <3 (WBC/hpf)    RBC / HPF 0-2  <3 (RBC/hpf)      Bacteria, UA FEW (*) RARE    BASIC METABOLIC PANEL     Status: Abnormal   Collection Time   03/23/12  5:07 AM      Component Value Range Comment   Sodium 135  135 - 145 (mEq/L)    Potassium 4.3  3.5 - 5.1 (mEq/L)    Chloride 99  96 - 112 (mEq/L)    CO2 23  19 - 32 (mEq/L)    Glucose, Bld 249 (*) 70 - 99 (mg/dL)    BUN 14  6 - 23 (mg/dL)    Creatinine, Ser 4.54  0.50 - 1.10 (mg/dL)    Calcium 09.8  8.4 - 10.5 (mg/dL)    GFR calc non Af Amer >90  >90 (mL/min)    GFR calc Af Amer >90  >90 (mL/min)   CBC     Status: Abnormal   Collection Time   03/23/12  5:07 AM      Component Value Range Comment   WBC 10.9 (*) 4.0 - 10.5 (K/uL)    RBC 4.31  3.87 - 5.11 (MIL/uL)    Hemoglobin 12.3  12.0 - 15.0 (g/dL)    HCT 11.9  14.7 - 82.9 (%)    MCV 84.5  78.0 - 100.0 (fL)    MCH 28.5  26.0 - 34.0 (pg)    MCHC 33.8  30.0 - 36.0 (g/dL)    RDW 56.2  13.0 - 86.5 (%)    Platelets 391  150 - 400 (K/uL)   URINALYSIS, ROUTINE W REFLEX MICROSCOPIC     Status: Abnormal   Collection Time   03/23/12 10:08 AM      Component Value Range Comment   Color, Urine YELLOW  YELLOW     APPearance CLEAR  CLEAR     Specific Gravity, Urine 1.027  1.005 - 1.030     pH 5.0  5.0 - 8.0     Glucose, UA NEGATIVE  NEGATIVE (mg/dL)    Hgb urine dipstick TRACE (*) NEGATIVE     Bilirubin Urine NEGATIVE  NEGATIVE     Ketones, ur NEGATIVE  NEGATIVE (mg/dL)    Protein, ur >784 (*) NEGATIVE (mg/dL)    Urobilinogen, UA 1.0  0.0 - 1.0 (mg/dL)    Nitrite NEGATIVE  NEGATIVE     Leukocytes, UA NEGATIVE  NEGATIVE    URINE MICROSCOPIC-ADD ON     Status: Abnormal   Collection Time   03/23/12 10:08 AM      Component Value Range Comment   Squamous Epithelial / LPF FEW (*) RARE     WBC, UA 0-2  <3 (WBC/hpf)    RBC / HPF 0-2  <3 (RBC/hpf)    Bacteria, UA RARE  RARE     Casts GRANULAR CAST (*) NEGATIVE    GLUCOSE, CAPILLARY     Status: Abnormal   Collection Time   03/23/12 12:13 PM      Component Value Range Comment    Glucose-Capillary 237 (*) 70 - 99 (mg/dL)    Comment 1 Notify RN      Ct Shoulder Left W Contrast  03/22/2012  *RADIOLOGY REPORT*  Clinical Data: Cellulitis or infection.  Fever.  CT OF THE LEFT SHOULDER WITH CONTRAST  Technique:  Multidetector CT imaging was performed following the standard protocol during bolus administration of intravenous contrast.  Contrast: OMNIPAQUE IOHEXOL 300 MG/ML  SOLN  Comparison: None.  Findings: I think there may be a small amount of fluid in the shoulder joint.  I wonder if there is some edema in the deltoid muscle, but that is difficult to state with certainty.  There is no evidence of fracture, degenerative disease of the joint or osteomyelitis.  I cannot identify a rotator cuff tear, though sensitivity is limited.  AC joint appears unremarkable.  Other regional soft tissues appear unremarkable.  IMPRESSION: Question small amount of fluid in the shoulder joint.  Question edema in the deltoid muscle, particularly anteriorly.  These findings are debatable and nonspecific.  See above for full discussion.  Original Report Authenticated By: Thomasenia Sales, M.D.    Review of Systems  Constitutional: Negative for fever.  Cardiovascular: Negative for chest pain.  Gastrointestinal: Negative for nausea and vomiting.  Musculoskeletal: Positive for joint pain.       Lt shoulder    Blood pressure 135/83, pulse 81, temperature 97.8 F (36.6 C), temperature source Oral, resp. rate 20, last menstrual period 10/14/2011, SpO2 92.00%. Physical Exam  Constitutional: She is oriented to person, place, and time.  Cardiovascular: Normal rate, regular rhythm and normal heart sounds.   No murmur heard. Respiratory: Effort normal and breath sounds normal. She has no wheezes.  GI: Soft. Bowel sounds are normal. There is no tenderness.  Neurological: She is alert and oriented to person, place, and time.  Skin: Skin is warm.  Psychiatric: She has a normal mood and affect. Her  behavior is normal. Judgment normal.     Assessment/Plan Sarcoidosis; HTN Left shoulder pain; induration; cellulitis Scheduled for shoulder joint aspiration either 4/11 or 4/12 And Left shoulder [place] with possible fluid aspiration or tissue biopsy Pt aware of procedures benefits and risks and agreeable to proceed. Consent are signed and in chart.  Eban Weick A 03/23/2012, 12:37 PM

## 2012-03-23 NOTE — H&P (Signed)
Internal Medicine Attending Admission Note Date: 03/23/2012  Patient name: [name] Medical record number: [id number] Date of birth: Apr 15, 1966 Age: 46 y.o. Gender: female  I saw and evaluated the patient. I reviewed the resident's note and I agree with the resident's findings and plan as documented in the resident's note, with additional comments as noted below.  Chief Complaint(s): Left shoulder and left upper arm pain.  History - key components related to admission: Patient is a 46 year old woman with a history of sarcoidosis, hypertension, and other problems as outlined in the medical history, admitted with pain in her left shoulder and left upper arm.  Her pain began about one week ago, and she was seen 5 days prior to admission at [facility] where radiographs and an ultrasound were performed.  She was started then on ciprofloxacin for UTI.  She presented here because of ongoing pain.  She has had some subjective fever, but has not taken her temperature at home.  Physical Exam - key components related to admission:  Filed Vitals:   03/22/12 1401 03/22/12 1801 03/22/12 2052 03/23/12 0625  BP: 167/112 152/99 128/78 135/83  Pulse: 90 95 95 81  Temp: 98.6 F (37 C) 100.4 F (38 C) 97.9 F (36.6 C) 97.8 F (36.6 C)  TempSrc: Oral Oral Oral Oral  Resp: 20 20 20 20   SpO2: 100% 97% 98% 92%   General: Alert, no acute distress Lungs: Clear Heart: Regular; no extra sounds or murmurs Abdomen: Bowel sounds present, soft, nontender Extremities: No lower extremity edema; left arm is tender in the biceps area; range of motion of her left shoulder is prevented by pain   Lab results:   Basic Metabolic Panel:  Basename 03/23/12 0507 03/22/12 0830  NA 135 137  K 4.3 3.6  CL 99 100  CO2 23 26  GLUCOSE 249* 130*  BUN 14 12  CREATININE 0.74 0.80  CALCIUM 10.3 10.1  MG -- --  PHOS -- --    CBC:  Basename 03/23/12 0507 03/22/12 0830  WBC 10.9* 10.7*  NEUTROABS -- 7.7    HGB 12.3 12.7  HCT 36.4 38.0  MCV 84.5 84.8  PLT 391 346    CBG:  Basename 03/23/12 1213  GLUCAP 237*   CRP  Date Value Range Status  03/22/2012 25.55* <0.60 (mg/dL) Final     Lab Results  Component Value Date   ESRSEDRATE 93* 03/22/2012     Urinalysis: Trace hemoglobin, ketones 15, protein 100, bilirubin small, leukocytes small, nitrite negative, squamous epithelial many, WBC 7-10, rbc's 0-2, bacteria few.  Urinalysis (repeat): Trace hemoglobin, protein greater than 300, nitrite negative, leukocytes negative, squamous epithelial few, WBC 0-2, RBC 0-2, bacteria rare, granular casts.   Imaging results:  Ct Shoulder Left W Contrast  03/22/2012  *RADIOLOGY REPORT*  Clinical Data: Cellulitis or infection.  Fever.  CT OF THE LEFT SHOULDER WITH CONTRAST  Technique:  Multidetector CT imaging was performed following the standard protocol during bolus administration of intravenous contrast.  Contrast: OMNIPAQUE IOHEXOL 300 MG/ML  SOLN  Comparison: None.  Findings: I think there may be a small amount of fluid in the shoulder joint.  I wonder if there is some edema in the deltoid muscle, but that is difficult to state with certainty.  There is no evidence of fracture, degenerative disease of the joint or osteomyelitis.  I cannot identify a rotator cuff tear, though sensitivity is limited.  AC joint appears unremarkable.  Other regional soft tissues appear unremarkable.  IMPRESSION: Question small amount of fluid in the shoulder joint.  Question edema in the deltoid muscle, particularly anteriorly.  These findings are debatable and nonspecific.  See above for full discussion.  Original Report Authenticated By: Thomasenia Sales, M.D.    Other results: EKG: Sinus rhythm; right bundle branch block and left anterior fascicular block.  Left upper extremity venous duplex: No evidence of deep vein or superficial thrombosis.    Assessment & Plan by Problem:  1.  Left shoulder and left arm  pain.  This may represent cellulitis/soft tissue infection involving mainly the deltoid and biceps area, although the pain seems extreme for typical cellulitis.  Myopathy related to the sarcoidosis is also a possibility.   Orthopedic consult by Dr. Dion Saucier was obtained and is appreciated; aspiration of patient's left shoulder is planned by interventional radiology to rule out septic joint.  Patient reports that her pain is somewhat better today.  Plan is to continue empiric IV antibiotics for cellulitis; joint aspiration by interventional radiology, with possible biopsy by IR as per Dr. Dion Saucier.  Would check a CK level given the hemoglobin present on urinalysis.  Would also check a rheumatoid factor and ANA given recent joint pain reported by patient involving the wrists, hands, and knees with a planned rheumatology referral by Dr. Shelle Iron.  2.  Sarcoidosis.  Patient is on chronic prednisone therapy; plan is to continue at increased dose during acute illness.  3.  Hypertension.  Initial blood pressures were moderately elevated, likely due to pain.  Plan is to continue home antihypertensive regimen and adjust if warranted.  4.  Recent UTI.  Plan is urine culture; repeat urinalysis did not show evidence of ongoing infection.

## 2012-03-23 NOTE — Progress Notes (Signed)
Inpatient Diabetes Program Recommendations  AACE/ADA: New Consensus Statement on Inpatient Glycemic Control  Target Ranges:  Prepandial:   less than 140 mg/dL      Peak postprandial:   less than 180 mg/dL (1-2 hours)      Critically ill patients:  140 - 180 mg/dL  Pager:  [phone] Hours:  8 am-10pm   Reason for Visit: Steroid-induced Hyperglycemia  Inpatient Diabetes Program Recommendations Insulin - Basal: May consider adding Lantus while on IV steroids HgbA1C: Please check HgbA1C: To assess glycemic control

## 2012-03-23 NOTE — Progress Notes (Signed)
         Subjective:  Patient reports pain as moderate.  This is well-controlled with pain medications.  Objective:   VITALS:   Filed Vitals:   03/22/12 1401 03/22/12 1801 03/22/12 2052 03/23/12 0625  BP: 167/112 152/99 128/78 135/83  Pulse: 90 95 95 81  Temp: 98.6 F (37 C) 100.4 F (38 C) 97.9 F (36.6 C) 97.8 F (36.6 C)  TempSrc: Oral Oral Oral Oral  Resp: 20 20 20 20   SpO2: 100% 97% 98% 92%    Her left shoulder does not have any significant warmth. There is an indurated area over the proximal humerus anteriorly. Distally this is soft. There is mild warmth over the indurated area. Her elbow motion is intact actively, although she does not like to move her shoulder at all.  LABS Lab Results  Component Value Date   HGB 12.3 03/23/2012   HGB 12.7 03/22/2012   HGB 14.0 12/15/2011   CBC    Component Value Date/Time   WBC 10.9* 03/23/2012 0507   RBC 4.31 03/23/2012 0507   HGB 12.3 03/23/2012 0507   HCT 36.4 03/23/2012 0507   PLT 391 03/23/2012 0507   MCV 84.5 03/23/2012 0507   MCH 28.5 03/23/2012 0507   MCHC 33.8 03/23/2012 0507   RDW 15.4 03/23/2012 0507   LYMPHSABS 1.6 03/22/2012 0830   MONOABS 0.9 03/22/2012 0830   EOSABS 0.4 03/22/2012 0830   BASOSABS 0.1 03/22/2012 0830    Lab Results  Component Value Date   INR 1.06 07/12/2011   Lab Results  Component Value Date   NA 135 03/23/2012   K 4.3 03/23/2012   CL 99 03/23/2012   CO2 23 03/23/2012   BUN 14 03/23/2012   CREATININE 0.74 03/23/2012   GLUCOSE 249* 03/23/2012   Ct Shoulder Left W Contrast  03/22/2012  *RADIOLOGY REPORT*  Clinical Data: Cellulitis or infection.  Fever.  CT OF THE LEFT SHOULDER WITH CONTRAST  Technique:  Multidetector CT imaging was performed following the standard protocol during bolus administration of intravenous contrast.  Contrast: OMNIPAQUE IOHEXOL 300 MG/ML  SOLN  Comparison: None.  Findings: I think there may be a small amount of fluid in the shoulder joint.  I wonder if there is some  edema in the deltoid muscle, but that is difficult to state with certainty.  There is no evidence of fracture, degenerative disease of the joint or osteomyelitis.  I cannot identify a rotator cuff tear, though sensitivity is limited.  AC joint appears unremarkable.  Other regional soft tissues appear unremarkable.  IMPRESSION: Question small amount of fluid in the shoulder joint.  Question edema in the deltoid muscle, particularly anteriorly.  These findings are debatable and nonspecific.  See above for full discussion.  Original Report Authenticated By: Thomasenia Sales, M.D.    Assessment/Plan: Principal Problem:  *Left arm cellulitis, question sarcoidosis inflammatory granulomatous reaction Active Problems:  Sarcoidosis  I have discussed with radiology the best course of action, and have recommended ultrasound guided aspiration of the left shoulder, and as a second aspiration to ultrasound the area of induration over the proximal arm, and if in fact there is a fluid collection, to aspirate that, and if not, to biopsy the proximal area of induration to send for evaluation. I suspect that this is in fact a subcutaneous inflammatory granulomatous response from her sarcoidosis.    Chanetta Moosman P 03/23/2012, 9:34 AM

## 2012-03-24 ENCOUNTER — Encounter (HOSPITAL_COMMUNITY): Payer: Self-pay | Admitting: Anesthesiology

## 2012-03-24 ENCOUNTER — Inpatient Hospital Stay (HOSPITAL_COMMUNITY): Payer: Managed Care, Other (non HMO) | Admitting: Anesthesiology

## 2012-03-24 ENCOUNTER — Encounter (HOSPITAL_COMMUNITY)
Admission: EM | Disposition: A | Discharge status: Home Health | Payer: Self-pay | Source: Ambulatory Visit | Attending: Internal Medicine

## 2012-03-24 ENCOUNTER — Encounter (HOSPITAL_COMMUNITY): Payer: Self-pay | Admitting: Orthopedic Surgery

## 2012-03-24 DIAGNOSIS — M009 Pyogenic arthritis, unspecified: Secondary | ICD-10-CM | POA: Diagnosis present

## 2012-03-24 HISTORY — DX: Pyogenic arthritis, unspecified: M00.9

## 2012-03-24 HISTORY — PX: SHOULDER ARTHROSCOPY: SHX128

## 2012-03-24 LAB — RHEUMATOID FACTOR: Rhuematoid fact SerPl-aCnc: 25 IU/mL — ABNORMAL HIGH (ref <=14)

## 2012-03-24 LAB — GLUCOSE, CAPILLARY
Glucose-Capillary: 163 mg/dL — ABNORMAL HIGH (ref 70–99)
Glucose-Capillary: 133 mg/dL — ABNORMAL HIGH (ref 70–99)
Glucose-Capillary: 177 mg/dL — ABNORMAL HIGH (ref 70–99)

## 2012-03-24 LAB — HIV ANTIBODY (ROUTINE TESTING W REFLEX): HIV: NONREACTIVE

## 2012-03-24 LAB — ANA: Anti Nuclear Antibody(ANA): POSITIVE — AB

## 2012-03-24 SURGERY — ARTHROSCOPY, SHOULDER
Anesthesia: General | Site: Shoulder | Laterality: Left | Wound class: Dirty or Infected

## 2012-03-24 MED ORDER — METOCLOPRAMIDE HCL 5 MG PO TABS
5.0000 mg | ORAL_TABLET | Freq: Three times a day (TID) | ORAL | Status: DC | PRN
  Filled 2012-03-24: qty 2

## 2012-03-24 MED ORDER — LACTATED RINGERS IV SOLN
INTRAVENOUS | Status: DC | PRN
  Administered 2012-03-24 (×2): via INTRAVENOUS

## 2012-03-24 MED ORDER — METOCLOPRAMIDE HCL 5 MG/ML IJ SOLN
5.0000 mg | Freq: Three times a day (TID) | INTRAMUSCULAR | Status: DC | PRN
  Filled 2012-03-24: qty 2

## 2012-03-24 MED ORDER — DEXTROSE 5 % IV SOLN
INTRAVENOUS | Status: AC
  Filled 2012-03-24: qty 50

## 2012-03-24 MED ORDER — SODIUM CHLORIDE 0.9 % IV SOLN
1250.0000 mg | Freq: Two times a day (BID) | INTRAVENOUS | Status: DC
  Administered 2012-03-25 – 2012-03-26 (×3): 1250 mg via INTRAVENOUS
  Filled 2012-03-24 (×4): qty 1250

## 2012-03-24 MED ORDER — ONDANSETRON HCL 4 MG/2ML IJ SOLN: 4.0000 mg | Freq: Once | INTRAMUSCULAR | Status: AC | PRN

## 2012-03-24 MED ORDER — DOCUSATE SODIUM 100 MG PO CAPS
100.0000 mg | ORAL_CAPSULE | Freq: Two times a day (BID) | ORAL | Status: DC
  Administered 2012-03-24 – 2012-03-27 (×7): 100 mg via ORAL
  Filled 2012-03-24 (×7): qty 1

## 2012-03-24 MED ORDER — PHENOL 1.4 % MT LIQD
1.0000 | OROMUCOSAL | Status: DC | PRN
  Filled 2012-03-24: qty 177

## 2012-03-24 MED ORDER — LACTATED RINGERS IV SOLN
INTRAVENOUS | Status: DC
  Administered 2012-03-24: 11:00:00 via INTRAVENOUS

## 2012-03-24 MED ORDER — DEXTROSE 5 % IV SOLN
500.0000 mg | Freq: Four times a day (QID) | INTRAVENOUS | Status: DC | PRN
  Filled 2012-03-24: qty 5

## 2012-03-24 MED ORDER — FENTANYL CITRATE 0.05 MG/ML IJ SOLN
INTRAMUSCULAR | Status: DC | PRN
  Administered 2012-03-24: 150 ug via INTRAVENOUS
  Administered 2012-03-24: 50 ug via INTRAVENOUS

## 2012-03-24 MED ORDER — ONDANSETRON HCL 4 MG/2ML IJ SOLN: 4.0000 mg | Freq: Four times a day (QID) | INTRAMUSCULAR | Status: DC | PRN

## 2012-03-24 MED ORDER — PROPOFOL 10 MG/ML IV EMUL
INTRAVENOUS | Status: DC | PRN
  Administered 2012-03-24: 180 mg via INTRAVENOUS

## 2012-03-24 MED ORDER — DIPHENHYDRAMINE HCL 12.5 MG/5ML PO ELIX
12.5000 mg | ORAL_SOLUTION | ORAL | Status: DC | PRN
  Filled 2012-03-24: qty 10

## 2012-03-24 MED ORDER — POTASSIUM CHLORIDE IN NACL 20-0.45 MEQ/L-% IV SOLN
INTRAVENOUS | Status: DC
  Administered 2012-03-24 – 2012-03-26 (×2): via INTRAVENOUS
  Filled 2012-03-24 (×5): qty 1000

## 2012-03-24 MED ORDER — GLYCOPYRROLATE 0.2 MG/ML IJ SOLN
INTRAMUSCULAR | Status: DC | PRN
  Administered 2012-03-24: .4 mg via INTRAVENOUS

## 2012-03-24 MED ORDER — ENOXAPARIN SODIUM 40 MG/0.4ML ~~LOC~~ SOLN
40.0000 mg | SUBCUTANEOUS | Status: DC
  Administered 2012-03-25: 40 mg via SUBCUTANEOUS
  Filled 2012-03-24 (×2): qty 0.4

## 2012-03-24 MED ORDER — ACETAMINOPHEN 325 MG PO TABS
650.0000 mg | ORAL_TABLET | Freq: Four times a day (QID) | ORAL | Status: DC | PRN
  Administered 2012-03-24: 650 mg via ORAL
  Filled 2012-03-24: qty 2

## 2012-03-24 MED ORDER — DEXTROSE 5 % IV SOLN
2.0000 g | INTRAVENOUS | Status: DC
  Administered 2012-03-24 – 2012-03-27 (×4): 2 g via INTRAVENOUS
  Filled 2012-03-24 (×4): qty 2

## 2012-03-24 MED ORDER — HYDROMORPHONE HCL PF 1 MG/ML IJ SOLN
0.5000 mg | INTRAMUSCULAR | Status: DC | PRN
  Administered 2012-03-24: 1 mg via INTRAVENOUS
  Administered 2012-03-24: 0.5 mg via INTRAVENOUS
  Administered 2012-03-25: 1 mg via INTRAVENOUS
  Filled 2012-03-24 (×3): qty 1

## 2012-03-24 MED ORDER — MIDAZOLAM HCL 5 MG/5ML IJ SOLN
INTRAMUSCULAR | Status: DC | PRN
  Administered 2012-03-24: 2 mg via INTRAVENOUS

## 2012-03-24 MED ORDER — ALUM & MAG HYDROXIDE-SIMETH 200-200-20 MG/5ML PO SUSP
30.0000 mL | ORAL | Status: DC | PRN
  Administered 2012-03-25: 30 mL via ORAL
  Filled 2012-03-24: qty 150

## 2012-03-24 MED ORDER — OXYCODONE HCL 5 MG PO TABS: 2.5000 mg | ORAL_TABLET | Freq: Once | ORAL | Status: DC | PRN

## 2012-03-24 MED ORDER — SENNA 8.6 MG PO TABS
1.0000 | ORAL_TABLET | Freq: Two times a day (BID) | ORAL | Status: DC
  Administered 2012-03-24 – 2012-03-26 (×5): 8.6 mg via ORAL
  Filled 2012-03-24 (×8): qty 1

## 2012-03-24 MED ORDER — NEOSTIGMINE METHYLSULFATE 1 MG/ML IJ SOLN
INTRAMUSCULAR | Status: DC | PRN
  Administered 2012-03-24: 3 mg via INTRAVENOUS

## 2012-03-24 MED ORDER — ACETAMINOPHEN 650 MG RE SUPP: 650.0000 mg | Freq: Four times a day (QID) | RECTAL | Status: DC | PRN

## 2012-03-24 MED ORDER — 0.9 % SODIUM CHLORIDE (POUR BTL) OPTIME
TOPICAL | Status: DC | PRN
  Administered 2012-03-24: 1000 mL

## 2012-03-24 MED ORDER — HYDROMORPHONE HCL PF 1 MG/ML IJ SOLN
0.2500 mg | INTRAMUSCULAR | Status: DC | PRN
  Administered 2012-03-24 (×3): 0.5 mg via INTRAVENOUS
  Filled 2012-03-24: qty 1

## 2012-03-24 MED ORDER — ONDANSETRON HCL 4 MG PO TABS: 4.0000 mg | ORAL_TABLET | Freq: Four times a day (QID) | ORAL | Status: DC | PRN

## 2012-03-24 MED ORDER — ONDANSETRON HCL 4 MG/2ML IJ SOLN
INTRAMUSCULAR | Status: DC | PRN
  Administered 2012-03-24: 4 mg via INTRAVENOUS

## 2012-03-24 MED ORDER — CLINDAMYCIN PHOSPHATE 600 MG/4ML IJ SOLN
INTRAMUSCULAR | Status: AC
  Filled 2012-03-24: qty 4

## 2012-03-24 MED ORDER — OXYCODONE-ACETAMINOPHEN 5-325 MG PO TABS
1.0000 | ORAL_TABLET | ORAL | Status: DC | PRN
  Administered 2012-03-25 – 2012-03-27 (×8): 2 via ORAL
  Filled 2012-03-24 (×8): qty 2

## 2012-03-24 MED ORDER — OXYCODONE HCL 5 MG PO TABS
5.0000 mg | ORAL_TABLET | ORAL | Status: DC | PRN
  Administered 2012-03-26: 5 mg via ORAL
  Administered 2012-03-27 (×2): 10 mg via ORAL
  Filled 2012-03-24 (×2): qty 2
  Filled 2012-03-24: qty 1

## 2012-03-24 MED ORDER — METHOCARBAMOL 500 MG PO TABS
500.0000 mg | ORAL_TABLET | Freq: Four times a day (QID) | ORAL | Status: DC | PRN
  Filled 2012-03-24: qty 1

## 2012-03-24 MED ORDER — MENTHOL 3 MG MT LOZG
1.0000 | LOZENGE | OROMUCOSAL | Status: DC | PRN
  Filled 2012-03-24: qty 9

## 2012-03-24 MED ORDER — ZOLPIDEM TARTRATE 5 MG PO TABS: 5.0000 mg | ORAL_TABLET | Freq: Every evening | ORAL | Status: DC | PRN

## 2012-03-24 MED ORDER — VANCOMYCIN HCL 1000 MG IV SOLR
2500.0000 mg | Freq: Once | INTRAVENOUS | Status: AC
  Administered 2012-03-24: 2500 mg via INTRAVENOUS
  Filled 2012-03-24: qty 2500

## 2012-03-24 MED ORDER — SODIUM CHLORIDE 0.9 % IR SOLN
Status: DC | PRN
  Administered 2012-03-24 (×5): 3000 mL

## 2012-03-24 MED ORDER — ROCURONIUM BROMIDE 100 MG/10ML IV SOLN
INTRAVENOUS | Status: DC | PRN
  Administered 2012-03-24: 50 mg via INTRAVENOUS

## 2012-03-24 MED ORDER — CEFAZOLIN SODIUM 1-5 GM-% IV SOLN
1.0000 g | Freq: Four times a day (QID) | INTRAVENOUS | Status: DC
  Filled 2012-03-24 (×3): qty 50

## 2012-03-24 SURGICAL SUPPLY — 56 items
BLADE CUTTER GATOR 3.5 (BLADE) ×2 IMPLANT
BLADE CUTTER MENIS 5.5 (BLADE) IMPLANT
BLADE GREAT WHITE 4.2 (BLADE) IMPLANT
BLADE SURG 15 STRL LF DISP TIS (BLADE) IMPLANT
BLADE SURG 15 STRL SS (BLADE)
BUR OVAL 4.0 (BURR) IMPLANT
BUR OVAL 6.0 (BURR) IMPLANT
CANISTER OMNI JUG 16 LITER (MISCELLANEOUS) IMPLANT
CANISTER SUCTION 2500CC (MISCELLANEOUS) IMPLANT
CANNULA SHOULDER 7CM (CANNULA) ×2 IMPLANT
DRAPE C-ARM 42X72 X-RAY (DRAPES) ×2 IMPLANT
DRAPE INCISE IOBAN 66X45 STRL (DRAPES) IMPLANT
DRAPE STERI 35X30 U-POUCH (DRAPES) ×2 IMPLANT
DRAPE U-SHAPE 47X51 STRL (DRAPES) IMPLANT
DRAPE U-SHAPE 76X120 STRL (DRAPES) ×4 IMPLANT
DRSG EMULSION OIL 3X3 NADH (GAUZE/BANDAGES/DRESSINGS) IMPLANT
DRSG PAD ABDOMINAL 8X10 ST (GAUZE/BANDAGES/DRESSINGS) ×2 IMPLANT
DURAPREP 26ML APPLICATOR (WOUND CARE) ×2 IMPLANT
ELECT REM PT RETURN 9FT ADLT (ELECTROSURGICAL)
ELECTRODE REM PT RTRN 9FT ADLT (ELECTROSURGICAL) IMPLANT
GAUZE SPONGE 4X4 16PLY XRAY LF (GAUZE/BANDAGES/DRESSINGS) IMPLANT
GLOVE BIOGEL PI IND STRL 6.5 (GLOVE) ×1 IMPLANT
GLOVE BIOGEL PI IND STRL 7.0 (GLOVE) ×1 IMPLANT
GLOVE BIOGEL PI IND STRL 8.5 (GLOVE) ×2 IMPLANT
GLOVE BIOGEL PI INDICATOR 6.5 (GLOVE) ×1
GLOVE BIOGEL PI INDICATOR 7.0 (GLOVE) ×1
GLOVE BIOGEL PI INDICATOR 8.5 (GLOVE) ×2
GLOVE ECLIPSE 6.5 STRL STRAW (GLOVE) ×4 IMPLANT
GLOVE SURG ORTHO 8.5 STRL (GLOVE) ×2 IMPLANT
GLOVE SURG SS PI 6.5 STRL IVOR (GLOVE) ×2 IMPLANT
GOWN PREVENTION PLUS XLARGE (GOWN DISPOSABLE) ×2 IMPLANT
GOWN PREVENTION PLUS XXLARGE (GOWN DISPOSABLE) IMPLANT
GOWN STRL NON-REIN LRG LVL3 (GOWN DISPOSABLE) ×10 IMPLANT
KIT BASIN OR (CUSTOM PROCEDURE TRAY) ×2 IMPLANT
NEEDLE 18GX1X1/2 (RX/OR ONLY) (NEEDLE) ×2 IMPLANT
PACK ARTHROSCOPY DSU (CUSTOM PROCEDURE TRAY) ×2 IMPLANT
PENCIL BUTTON HOLSTER BLD 10FT (ELECTRODE) IMPLANT
SET ARTHROSCOPY TUBING (MISCELLANEOUS) ×1
SET ARTHROSCOPY TUBING LN (MISCELLANEOUS) ×1 IMPLANT
SLING ARM FOAM STRAP LRG (SOFTGOODS) ×2 IMPLANT
SLING ARM IMMOBILIZER LRG (SOFTGOODS) IMPLANT
SLING ARM IMMOBILIZER MED (SOFTGOODS) IMPLANT
SPONGE GAUZE 4X4 12PLY (GAUZE/BANDAGES/DRESSINGS) IMPLANT
SPONGE LAP 4X18 X RAY DECT (DISPOSABLE) ×2 IMPLANT
SUT ETHIBOND 2 OS 4 DA (SUTURE) IMPLANT
SUT ETHILON 3 0 PS 1 (SUTURE) IMPLANT
SUT ETHILON 4 0 PS 2 18 (SUTURE) ×2 IMPLANT
SUT VIC AB 2-0 FS1 27 (SUTURE) IMPLANT
SUT VIC AB 2-0 SH 27 (SUTURE)
SUT VIC AB 2-0 SH 27XBRD (SUTURE) IMPLANT
SYR BULB 3OZ (MISCELLANEOUS) IMPLANT
TAPE CLOTH SURG 6X10 WHT LF (GAUZE/BANDAGES/DRESSINGS) ×2 IMPLANT
TOWEL OR 17X24 6PK STRL BLUE (TOWEL DISPOSABLE) ×2 IMPLANT
TUBE CONNECTING 12X1/4 (SUCTIONS) IMPLANT
WATER STERILE IRR 1000ML POUR (IV SOLUTION) ×2 IMPLANT
YANKAUER SUCT BULB TIP NO VENT (SUCTIONS) IMPLANT

## 2012-03-24 NOTE — Consult Note (Signed)
Date of Admission:  03/22/2012  Date of Consult:  03/24/2012  Reason for Consult: Septic shoulder Referring Physician: Dr. Meredith Pel   HPI: [name] is an 46 y.o. female with a history of sarcoidosis, HTN who had developed painful left shoulder and arm with edema extending into the hand. She had   associated achy pain involving the middle of her back and shoulder blades. She denies any trauma/injury. The pain was intermittent at first and then became continuous, eventually spreading distally to involve her entire arm. The pain at times feels sharp anteriorly along her arm. She describes a swelling sensation of her arm and weakness secondary to pain. She also noted subjective fevers and chills and thought that she had a "bladder infection" She was seen  At [facility] and and had radiographs and ultrasound performed. Imaging demonstrated normal humerus, some calcific tendonitis of the left rotator cuff and some undersurface osteophytes at the acromion process (a source of possible impingment), and no left upper extremity clot. She was given empiric ciprofloxacin for UTI. She was instructed to return to the hospital after a few days if the pain persisted or got worse for further evaluation. Her pain in shoulder worsened and she presented to the ED yesterday and was admitted to the IM Teaching service. She had clindamycin added to her ciprofloxacin. 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 Pt taken to the or today by Dr. Dion Saucier who described to me over the phone his encountering an extensive amount of purulent material and inflammation in the joint which he cultured and washed out. We were consulted to assist with management of this septic joint.    Past Medical History  Diagnosis Date  . Hypertension   . Ovarian cyst   . Fibroid tumor   . Sarcoidosis   . Complication of anesthesia     "felt like it was difficult to come out; very nauseated"  . PONV (postoperative  nausea and vomiting)   . Angina   . PVC (premature ventricular contraction)   . Bifascicular block   . Arrhythmia 03/22/12    "I have an irregular heartbeat all the time"  . Left arm cellulitis, question sarcoidosis inflammatory granulomatous reaction 03/23/2012  . Septic arthritis of shoulder, left 03/24/2012    Past Surgical History  Procedure Date  . Ovarian cyst removal 1986    "included right tube removed"  . Tubal ligation 1991  . Bladder surgery 1966    bladder/kidney surgery to reconnect tubes.  . Uterine fibroid surgery     "three since 1986"  . Tonsillectomy and adenoidectomy 1984  . Right oophorectomy 1986  . Appendectomy 1988  ergies:   No Known Allergies   Medications: I have reviewed patients current medications as documented in Epic Anti-infectives     Start     Dose/Rate Route Frequency Ordered Stop   03/24/12 1600   cefTRIAXone (ROCEPHIN) 2 g in dextrose 5 % 50 mL IVPB        2 g 100 mL/hr over 30 Minutes Intravenous Every 24 hours 03/24/12 1459     03/24/12 1535   ceFAZolin (ANCEF) IVPB 1 g/50 mL premix  Status:  Discontinued        1 g 100 mL/hr over 30 Minutes Intravenous Every 6 hours 03/24/12 1431 03/24/12 1459   03/22/12 1900   clindamycin (CLEOCIN) IVPB 600 mg  Status:  Discontinued        600 mg 100 mL/hr over 30 Minutes  Intravenous 3 times per day 03/22/12 1730 03/24/12 1459   03/22/12 1715   clindamycin (CLEOCIN) IVPB 600 mg  Status:  Discontinued        600 mg 100 mL/hr over 30 Minutes Intravenous 3 times per day 03/22/12 1710 03/22/12 1731   03/22/12 1045   clindamycin (CLEOCIN) IVPB 600 mg        600 mg 100 mL/hr over 30 Minutes Intravenous  Once 03/22/12 1034 03/22/12 1122          Social History:  reports that she has never smoked. She has never used smokeless tobacco. She reports that she does not drink alcohol or use illicit drugs.  Family History  Problem Relation Age of Onset  . Anemia Sister   . Anemia Sister   . Diabetes  Mother   . Heart disease Mother   . Hypertension Mother   . Diabetes Father   . Hypertension Father   . Scleroderma Father     As in HPI and primary teams notes otherwise 12 point review of systems is negative  Blood pressure 157/97, pulse 76, temperature 97.1 F (36.2 C), temperature source Oral, resp. rate 20, last menstrual period 10/14/2011, SpO2 94.00%.  General: Alert and awake, oriented x3, not in any acute distress. 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 Neuro: nonfocal, strength and sensation intact   Results for orders placed during the hospital encounter of 03/22/12 (from the past 48 hour(s))  URINALYSIS, ROUTINE W REFLEX MICROSCOPIC     Status: Abnormal   Collection Time   03/22/12  6:01 PM      Component Value Range Comment   Color, Urine AMBER (*) YELLOW  BIOCHEMICALS MAY BE AFFECTED BY COLOR   APPearance HAZY (*) CLEAR     Specific Gravity, Urine 1.033 (*) 1.005 - 1.030     pH 5.5  5.0 - 8.0     Glucose, UA NEGATIVE  NEGATIVE (mg/dL)    Hgb urine dipstick TRACE (*) NEGATIVE     Bilirubin Urine SMALL (*) NEGATIVE     Ketones, ur 15 (*) NEGATIVE (mg/dL)    Protein, ur 409 (*) NEGATIVE (mg/dL)    Urobilinogen, UA 1.0  0.0 - 1.0 (mg/dL)    Nitrite NEGATIVE  NEGATIVE     Leukocytes, UA SMALL (*) NEGATIVE    URINE MICROSCOPIC-ADD ON     Status: Abnormal   Collection Time   03/22/12  6:01 PM      Component Value Range Comment   Squamous Epithelial / LPF MANY (*) RARE     WBC, UA 7-10  <3 (WBC/hpf)    RBC / HPF 0-2  <3 (RBC/hpf)    Bacteria, UA FEW (*) RARE    GC/CHLAMYDIA PROBE AMP, URINE     Status: Normal   Collection Time    03/22/12  6:01 PM      Component Value Range Comment   GC Probe Amp, Urine NEGATIVE  NEGATIVE     Chlamydia, Swab/Urine, PCR NEGATIVE  NEGATIVE    BASIC METABOLIC PANEL     Status: Abnormal   Collection Time   03/23/12  5:07 AM      Component Value Range Comment   Sodium 135  135 - 145 (mEq/L)    Potassium 4.3  3.5 - 5.1 (mEq/L)    Chloride 99  96 - 112 (mEq/L)    CO2 23  19 - 32 (mEq/L)    Glucose, Bld 249 (*) 70 - 99 (mg/dL)    BUN 14  6 - 23 (mg/dL)    Creatinine, Ser 1.61  0.50 - 1.10 (mg/dL)    Calcium 09.6  8.4 - 10.5 (mg/dL)    GFR calc non Af Amer >90  >90 (mL/min)    GFR calc Af Amer >90  >90 (mL/min)   CBC     Status: Abnormal   Collection Time   03/23/12  5:07 AM      Component Value Range Comment   WBC 10.9 (*) 4.0 - 10.5 (K/uL)    RBC 4.31  3.87 - 5.11 (MIL/uL)    Hemoglobin 12.3  12.0 - 15.0 (g/dL)    HCT 04.5  40.9 - 81.1 (%)    MCV 84.5  78.0 - 100.0 (fL)    MCH 28.5  26.0 - 34.0 (pg)    MCHC 33.8  30.0 - 36.0 (g/dL)    RDW 91.4  78.2 - 95.6 (%)    Platelets 391  150 - 400 (K/uL)   URINALYSIS, ROUTINE W REFLEX MICROSCOPIC     Status: Abnormal   Collection Time   03/23/12 10:08 AM      Component Value Range Comment   Color, Urine YELLOW  YELLOW     APPearance CLEAR  CLEAR     Specific Gravity, Urine 1.027  1.005 - 1.030     pH 5.0  5.0 - 8.0     Glucose, UA NEGATIVE  NEGATIVE (mg/dL)    Hgb urine dipstick TRACE (*) NEGATIVE     Bilirubin Urine NEGATIVE  NEGATIVE     Ketones, ur NEGATIVE  NEGATIVE (mg/dL)    Protein, ur >213 (*) NEGATIVE (mg/dL)    Urobilinogen, UA 1.0  0.0 - 1.0 (mg/dL)    Nitrite NEGATIVE  NEGATIVE     Leukocytes, UA NEGATIVE  NEGATIVE    URINE MICROSCOPIC-ADD ON     Status: Abnormal   Collection Time   03/23/12 10:08 AM      Component Value Range Comment   Squamous Epithelial / LPF FEW (*) RARE     WBC, UA 0-2  <3 (WBC/hpf)    RBC / HPF 0-2  <3 (RBC/hpf)    Bacteria, UA RARE  RARE     Casts GRANULAR CAST (*) NEGATIVE    RHEUMATOID  FACTOR     Status: Abnormal   Collection Time   03/23/12 10:49 AM      Component Value Range Comment   Rheumatoid Factor 25 (*) <=14 (IU/mL)   ANA     Status: Abnormal   Collection Time   03/23/12 10:49 AM      Component Value Range Comment   ANA POSITIVE (*) NEGATIVE    GLUCOSE, CAPILLARY     Status: Abnormal   Collection Time   03/23/12 12:13 PM      Component Value Range Comment   Glucose-Capillary 237 (*) 70 - 99 (mg/dL)    Comment 1 Notify RN     CELL COUNT + DIFF,  W/ CRYST-SYNVL FLD     Status: Abnormal   Collection Time   03/23/12  4:00 PM      Component Value Range Comment   Color, Synovial YELLOW  YELLOW     Appearance-Synovial TURBID (*)  CLEAR     Crystals, Fluid NO CRYSTALS SEEN      WBC, Synovial 78295 (*) 0 - 200 (/cu mm) COUNT MAY BE INACCURATE DUE TO FIBRIN CLUMPS.   Neutrophil, Synovial 99 (*) 0 - 25 (%)    Lymphocytes-Synovial Fld 0  0 - 20 (%)    Monocyte-Macrophage-Synovial Fluid 1 (*) 50 - 90 (%)    Eosinophils-Synovial 0  0 - 1 (%)   GLUCOSE, CAPILLARY     Status: Abnormal   Collection Time   03/23/12  4:38 PM      Component Value Range Comment   Glucose-Capillary 206 (*) 70 - 99 (mg/dL)    Comment 1 Notify RN      Comment 2 Documented in Chart     CK TOTAL AND CKMB     Status: Normal   Collection Time   03/23/12  4:46 PM      Component Value Range Comment   Total CK 175  7 - 177 (U/L)    CK, MB 1.7  0.3 - 4.0 (ng/mL)    Relative Index 1.0  0.0 - 2.5    BODY FLUID CULTURE     Status: Normal (Preliminary result)   Collection Time   03/23/12  5:31 PM      Component Value Range Comment   Specimen Description SYNOVIAL FLUID SHOULDER      Special Requests SYR      Gram Stain        Value: ABUNDANT WBC PRESENT, PREDOMINANTLY PMN     NO ORGANISMS SEEN   Culture PENDING      Report Status PENDING     GLUCOSE, CAPILLARY     Status: Abnormal   Collection Time   03/23/12  8:14 PM      Component Value Range Comment   Glucose-Capillary 210 (*) 70 - 99  (mg/dL)    Comment 1 Documented in Chart      Comment 2 Notify RN     GLUCOSE, CAPILLARY     Status: Abnormal   Collection Time   03/24/12 12:15 AM      Component Value Range Comment   Glucose-Capillary 224 (*) 70 - 99 (mg/dL)   GLUCOSE, CAPILLARY     Status: Abnormal   Collection Time   03/24/12  8:17 AM      Component Value Range Comment   Glucose-Capillary 163 (*) 70 - 99 (mg/dL)    Comment 1 Documented in Chart      Comment 2 Notify RN     GLUCOSE, CAPILLARY     Status: Abnormal   Collection Time   03/24/12  1:32 PM      Component Value Range Comment   Glucose-Capillary 133 (*) 70 - 99 (mg/dL)       Component Value Date/Time   SDES SYNOVIAL FLUID SHOULDER 03/23/2012 1731   SPECREQUEST SYR 03/23/2012 1731   CULT PENDING 03/23/2012 1731   REPTSTATUS PENDING 03/23/2012 1731   Ct Shoulder Left W Contrast  03/22/2012  *RADIOLOGY REPORT*  Clinical Data: Cellulitis or infection.  Fever.  CT OF THE LEFT SHOULDER WITH CONTRAST  Technique:  Multidetector CT imaging was performed following the standard protocol during bolus administration of intravenous contrast.  Contrast: OMNIPAQUE IOHEXOL 300 MG/ML  SOLN  Comparison: None.  Findings: I think there may be a small amount of fluid in the shoulder joint.  I wonder if there is some edema in the deltoid muscle, but that is difficult to  state with certainty.  There is no evidence of fracture, degenerative disease of the joint or osteomyelitis.  I cannot identify a rotator cuff tear, though sensitivity is limited.  AC joint appears unremarkable.  Other regional soft tissues appear unremarkable.  IMPRESSION: Question small amount of fluid in the shoulder joint.  Question edema in the deltoid muscle, particularly anteriorly.  These findings are debatable and nonspecific.  See above for full discussion.  Original Report Authenticated By: Thomasenia Sales, M.D.   Dg Fluoro Guide Ndl Plc/bx  03/23/2012  *RADIOLOGY REPORT*  Clinical Data: Shoulder  joint effusion.  Possible septic joint. Abnormal fluid and edema along the pectoralis and deltoid musculature anteriorly.  FLUORO GUIDED LEFT SHOULDER ARTHROCENTESIS  Comparison: CT scan dated 03/22/2012  Procedure:  I discussed the risks (including hemorrhage and introducing infection into the glenohumeral joint, among others), benefits, and alternatives to fluoroscopically guided left shoulder arthrocentesis with the patient.  We discussed the good likelihood of technical success of the procedure.  She understood and elected to undergo the procedure.  Because of the findings in the anterior musculature on yesterday's CT scan, I elected for a posterior approach to the glenohumeral joint in order to avoid contaminating the joint with any of the anterior fluid.  Following sterile skin prep and local anesthetic administration consisting of 1% lidocaine, a 6 inches 20 gauge needle was advanced into the upper portion of the left glenohumeral joint from a posterior approach.  Omnipaque-300 contrast was injected into the joint and demonstrated irregular joint contour suggesting synovitis.  Initial attempts to aspirate from the joint were unsuccessful, possibly due to blocking of the needle tip by the inflamed synovium.  After repositioning, I aspirated 11 ml of cloudy yellow fluid from the glenohumeral joint.  The needle was subsequently removed and the skin cleansed and bandaged.  The syringe containing the aspirated fluid was labeled and sent to the lab.  No immediate complications were observed.  IMPRESSION:  1.  Technically successful fluoroscopically guided left glenohumeral joint aspiration, from posterior approach.  A total of 11 ml of cloudy yellow fluid was withdrawn from the glenohumeral joint and sent to the lab.  Original Report Authenticated By: Dellia Cloud, M.D.     Recent Results (from the past 720 hour(s))  BODY FLUID CULTURE     Status: Normal (Preliminary result)   Collection Time    03/23/12  5:31 PM      Component Value Range Status Comment   Specimen Description SYNOVIAL FLUID SHOULDER   Final    Special Requests SYR   Final    Gram Stain     Final    Value: ABUNDANT WBC PRESENT, PREDOMINANTLY PMN     NO ORGANISMS SEEN   Culture PENDING   Incomplete    Report Status PENDING   Incomplete      Impression/Recommendation 46 year old with sarcoidosis who has a septic shoulder sp washout  1) Septic shoulder: Unfortunately antecedent ciprofloxacin and clindamycin we may NOt recover an organism from cultures  --For now would DC the clindamycin and ciprofloxacin (in part due to very high risk of c difficile with either of these agents) --start vancomycin dosed per pharmacy and rocephin 2 grams daily --set up for 6 weeks with IV antibiotics --followup cultures and tailor abx --check esr, crp  2) Screening: check hiv  Dr. Orvan Falconer available on the weekend for questions.   Thank you so much for this interesting consult,   Paulette Blanch Dam  03/24/2012, 3:21 PM   [phone] (pager) [phone] (office)

## 2012-03-24 NOTE — Progress Notes (Signed)
ANTIBIOTIC CONSULT NOTE - INITIAL  Pharmacy Consult for Vancomycin Indication: septic joint (L shoulder)  No Known Allergies  Patient Measurements:   Weight ~ 138 kg Height ~ 70 inches IBW = 68.5 kg   Vital Signs: Temp: 97.1 F (36.2 C) (04/12 1457) Temp src: Oral (04/12 1457) BP: 157/97 mmHg (04/12 1457) Pulse Rate: 76  (04/12 1457) Intake/Output from previous day: 04/11 0701 - 04/12 0700 In: -  Out: 400 [Urine:400] Intake/Output from this shift: Total I/O In: 1300 [I.V.:1300] Out: -   Labs:  Basename 03/23/12 0507 03/22/12 0830  WBC 10.9* 10.7*  HGB 12.3 12.7  PLT 391 346  LABCREA -- --  CREATININE 0.74 0.80   CrCl ~ 100 ml/min    Microbiology: Recent Results (from the past 720 hour(s))  BODY FLUID CULTURE     Status: Normal (Preliminary result)   Collection Time   03/23/12  5:31 PM      Component Value Range Status Comment   Specimen Description SYNOVIAL FLUID SHOULDER   Final    Special Requests SYR   Final    Gram Stain     Final    Value: ABUNDANT WBC PRESENT, PREDOMINANTLY PMN     NO ORGANISMS SEEN   Culture PENDING   Incomplete    Report Status PENDING   Incomplete     Medical History: Past Medical History  Diagnosis Date  . Hypertension   . Ovarian cyst   . Fibroid tumor   . Sarcoidosis   . Complication of anesthesia     "felt like it was difficult to come out; very nauseated"  . PONV (postoperative nausea and vomiting)   . Angina   . PVC (premature ventricular contraction)   . Bifascicular block   . Arrhythmia 03/22/12    "I have an irregular heartbeat all the time"  . Left arm cellulitis, question sarcoidosis inflammatory granulomatous reaction 03/23/2012  . Septic arthritis of shoulder, left 03/24/2012    Medications:  Prescriptions prior to admission  Medication Sig Dispense Refill  . acetaminophen (TYLENOL) 500 MG tablet Take 1,000 mg by mouth every 6 (six) hours as needed. For pain.       . hydrochlorothiazide 25 MG tablet  Take 1 tablet by mouth daily.      Marland Kitchen HYDROmorphone (DILAUDID) 2 MG tablet Take 2 mg by mouth every 6 (six) hours as needed. For pain      . metoprolol tartrate (LOPRESSOR) 25 MG tablet Take 12.5 mg by mouth 2 (two) times daily.        . predniSONE (DELTASONE) 10 MG tablet 10 mg daily. Take as directed      . terbinafine (LAMISIL) 250 MG tablet Take 250 mg by mouth daily.       Assessment:  46 yo F presented to the ER with left shoulder and arm pain with edema extending into the hand.  IR has aspirated the shoulder joint and found >65K cells, predominately PMNs.  Pt went to the OR 4/12 for I&D and washout.  Plan 6 weeks of IV antibiotics.  Antibiotic history: Cipro outpatient (for empiric UTI) Clinda 600mg  IV Q8h (4/10>>4/12) Rocephin 4/12 >> Vanc 4/12 >>   Goal of Therapy:  Vancomycin trough level 15-20 mcg/ml  Plan:  Vancomycin 2500 mg IV x 1 dose. Vancomycin 1250 mg IV Q12h. Vancomycin trough level at steady state. Follow-up culture data and clinical progress.   [employer], Pharm.D., BCPS Clinical Pharmacist Pager [phone] 03/24/2012 3:53 PM

## 2012-03-24 NOTE — Progress Notes (Signed)
Medical Student Daily Progress Note  Subjective: Underwent shoulder joint aspiration yesterday and has been in increased pain attributed to the procedure.  Has had all her PRN percocet and increased dose acetaminophen but has suffered lack of sleep secondary to pain, but no acute events overnight.  Was NPO this AM in preparation for muscle biopsy today.    Objective: Vital signs in last 24 hours: Filed Vitals:   03/23/12 1452 03/23/12 2137 03/24/12 0538 03/24/12 1022  BP: 159/82 149/92 146/89 169/97  Pulse: 88 79 70 74  Temp: 98.7 F (37.1 C) 97.5 F (36.4 C) 97.6 F (36.4 C) 98 F (36.7 C)  TempSrc: Oral Oral Oral Oral  Resp: 22 20 20 18   SpO2: 99% 97% 97% 98%    Intake/Output Summary (Last 24 hours) at 03/24/12 1212 Last data filed at 03/24/12 1610  Gross per 24 hour  Intake      0 ml  Output    400 ml  Net   -400 ml   Physical Exam: - General - alert and oriented x3, lying comfortably on bed, NAD  -HEENT - atraumatic, mucous membranes moist. Not able to examine for adenopathy of axilla because patient unable to raise her arms  -Neck: no thyromegaly, no JVD  - Cardiac - rhythm somewhat irregular, normal rate, no murmurs, rubs, or gallops  - Lung - CTAB, no crackles or wheezes  - Abdomen - normal bowel sounds, no masses or tenderness to palpation, soft, non-distended  - Neuro - left upper extremity difficult to assess as weakness was admittedly secondary to pain, hand movements however revealed intact finger extension, thumb/index opposition, and digit abduction/adduction; otherwise CNs and motor grossly intact  - Skin - borders of indurated and warm erythema located on the medial/anterior aspect of upper left arm near axilla with interval recession within 8x5 inch pen-drawn demarcated area, very tender to palpation, blanches slightly with pressure, axillary nodes not able to palpated  - Pulses - +2 pulses x4 extremities  Lab Results: Basic Metabolic Panel:  Lab 03/23/12  0507 03/22/12 0830  NA 135 137  K 4.3 3.6  CL 99 100  CO2 23 26  GLUCOSE 249* 130*  BUN 14 12  CREATININE 0.74 0.80  CALCIUM 10.3 10.1  MG -- --  PHOS -- --   CBC:  Lab 03/23/12 0507 03/22/12 0830  WBC 10.9* 10.7*  NEUTROABS -- 7.7  HGB 12.3 12.7  HCT 36.4 38.0  MCV 84.5 84.8  PLT 391 346   Cardiac Enzymes:  Lab 03/23/12 1646  CKTOTAL 175  CKMB 1.7  CKMBINDEX --  TROPONINI --   CBG:  Lab 03/24/12 0817 03/24/12 0015 03/23/12 2014 03/23/12 1638 03/23/12 1213  GLUCAP 163* 224* 210* 206* 237*   Urinalysis:  Lab 03/23/12 1008 03/22/12 1801  COLORURINE YELLOW AMBER*  LABSPEC 1.027 1.033*  PHURINE 5.0 5.5  GLUCOSEU NEGATIVE NEGATIVE  HGBUR TRACE* TRACE*  BILIRUBINUR NEGATIVE SMALL*  KETONESUR NEGATIVE 15*  PROTEINUR >300* 100*  UROBILINOGEN 1.0 1.0  NITRITE NEGATIVE NEGATIVE  LEUKOCYTESUR NEGATIVE SMALL*   Misc. Labs: RF 24 Urine GC-Ch negative  Micro Results: Recent Results (from the past 240 hour(s))  BODY FLUID CULTURE     Status: Normal (Preliminary result)   Collection Time   03/23/12  5:31 PM      Component Value Range Status Comment   Specimen Description SYNOVIAL FLUID SHOULDER   Final    Special Requests SYR   Final    Gram Stain  Final    Value: ABUNDANT WBC PRESENT, PREDOMINANTLY PMN     NO ORGANISMS SEEN   Culture PENDING   Incomplete    Report Status PENDING   Incomplete    Studies/Results: Ct Shoulder Left W Contrast  03/22/2012  *RADIOLOGY REPORT*  Clinical Data: Cellulitis or infection.  Fever.  CT OF THE LEFT SHOULDER WITH CONTRAST  Technique:  Multidetector CT imaging was performed following the standard protocol during bolus administration of intravenous contrast.  Contrast: OMNIPAQUE IOHEXOL 300 MG/ML  SOLN  Comparison: None.  Findings: I think there may be a small amount of fluid in the shoulder joint.  I wonder if there is some edema in the deltoid muscle, but that is difficult to state with certainty.  There is no  evidence of fracture, degenerative disease of the joint or osteomyelitis.  I cannot identify a rotator cuff tear, though sensitivity is limited.  AC joint appears unremarkable.  Other regional soft tissues appear unremarkable.  IMPRESSION: Question small amount of fluid in the shoulder joint.  Question edema in the deltoid muscle, particularly anteriorly.  These findings are debatable and nonspecific.  See above for full discussion.  Original Report Authenticated By: Thomasenia Sales, M.D.   Dg Fluoro Guide Ndl Plc/bx  03/23/2012  *RADIOLOGY REPORT*  Clinical Data: Shoulder joint effusion.  Possible septic joint. Abnormal fluid and edema along the pectoralis and deltoid musculature anteriorly.  FLUORO GUIDED LEFT SHOULDER ARTHROCENTESIS  Comparison: CT scan dated 03/22/2012  Procedure:  I discussed the risks (including hemorrhage and introducing infection into the glenohumeral joint, among others), benefits, and alternatives to fluoroscopically guided left shoulder arthrocentesis with the patient.  We discussed the good likelihood of technical success of the procedure.  She understood and elected to undergo the procedure.  Because of the findings in the anterior musculature on yesterday's CT scan, I elected for a posterior approach to the glenohumeral joint in order to avoid contaminating the joint with any of the anterior fluid.  Following sterile skin prep and local anesthetic administration consisting of 1% lidocaine, a 6 inches 20 gauge needle was advanced into the upper portion of the left glenohumeral joint from a posterior approach.  Omnipaque-300 contrast was injected into the joint and demonstrated irregular joint contour suggesting synovitis.  Initial attempts to aspirate from the joint were unsuccessful, possibly due to blocking of the needle tip by the inflamed synovium.  After repositioning, I aspirated 11 ml of cloudy yellow fluid from the glenohumeral joint.  The needle was subsequently removed and  the skin cleansed and bandaged.  The syringe containing the aspirated fluid was labeled and sent to the lab.  No immediate complications were observed.  IMPRESSION:  1.  Technically successful fluoroscopically guided left glenohumeral joint aspiration, from posterior approach.  A total of 11 ml of cloudy yellow fluid was withdrawn from the glenohumeral joint and sent to the lab.  Original Report Authenticated By: Dellia Cloud, M.D.   Medications: I have reviewed the patient's current medications. Scheduled Meds:   . clindamycin (CLEOCIN) IV  600 mg Intravenous Q8H  . hydrochlorothiazide  25 mg Oral Daily  . insulin aspart  0-24 Units Subcutaneous Q4H  . methylPREDNISolone (SOLU-MEDROL) injection  60 mg Intravenous Q12H  . metoprolol tartrate  12.5 mg Oral BID  . white petrolatum      . DISCONTD: heparin  5,000 Units Subcutaneous Q8H   Continuous Infusions:   . lactated ringers 50 mL/hr at 03/24/12 1100   PRN  Meds:.acetaminophen, iohexol, ondansetron (ZOFRAN) IV, ondansetron, oxyCODONE-acetaminophen, DISCONTD: oxyCODONE  Assessment/Plan: 1) Left shoulder/arm pain: Less pain and swelling each day since admission. Differential diagnosis on admission included cellulitis vs. rotator cuff tendonitis vs. joint infection vs. Inflammatory granulomatous reaction secondary to sarcoid. Her presenting symptoms were concerning for joint infection given her very limited ROM and her elevated ESR & sed rate. Tmax the night of 03/23/12 of 100.4 with a WBC of 10.9. Ordered shoulder MRI but patient was too wide to fit into the machine. Shoulder CT revealed possible small amount of fluid collection in shoulder joint and possible edema in anterior deltoid, interpreted as debatable and non-specific findings. LUE ultrasound demonstrated no thrombus.  As per Orthopedic consult (Dr. Dion Saucier), shoulder joint arthrocentesis with synovial fluid analysis was performed 03/23/12, which revealed 11mL yellow turbid fluid,  65,800 WBCs, 99% PMNs, no crystals or anything on gram stain.  Serum RF was 24.  Synovial fluid results suggest inflammatory vs septic joint.  In response to synovial fluid results, Orthopedics recommended emergent  Surgical arthroscopy with debridement with possible open debridement.  Orthopedics also suggested muscle biopsy to evaluate sarcoid-related myositis.  ID consult (Dr. Algis Liming) was concerned for septic joint and recommended discontinuation of clindamycin to prevent causing C-diff colitis and recommended extended therapy with vancomycin and rocephin, thus necessitating the eventual need for a PICC line. - Solumedrol 60 mg bid for inflammation  - Percocet 5 mg q 4 hours PRN for pain  - Follow up synovial fluid culture and glucose - Follow up muscle biopsy - Follow up anti-CCP Ig - Discontinue clindamycin (03/24/12) - Start vancomycin and rocephin  2) Dysuria: Return of symptoms following recent empiric treatment with Cipro for dysuria x 5 days. Urinalysis (03/22/12) showed small LE, neg nitrates, 100 protein, 7-10 WBCs, but did have many squamous epithelial cells, suggestive of contaminated sample.  Combination of acute swollen joint and dysuria raised suspicion for STD versus UTI.  However, urine was found to be GC-Ch negative and repeat urinalysis and urine culture (with clean catch sample) was benign.  3) Sarcoidosis: CRP 25.55, ESR 93 (was 19 five months ago). Denies shortness of breath, has no crackles on exam. Per patient, her cardiac dysrhythmia are due to sarcoid. Sees pulmonologist Dr. Shelle Iron, on prednisone 10 mg at home, admits to often missing doses. Has had recent complaints of increased generalized joint pains and was referred to rheumatologist, but has not yet made it to an appointment. Blood glucose is acutely elevated (249 today) likely secondary to raised dose therapeutic steroids.  - Continue solumedrol, higher dose (60 mg) given to also help relieve inflammation   4) HTN: BP  167/112 on admission likely largely due to pain. Last 12-lead EKG on 07/15/11 showed bifascicular block and PVCs, last dobutamine stress ECHO on 07/29/11 showed showed normal LV function and no evidence of ischemia. EKG this admission showed nothing new.  Currently lower with better pain control.  - Continue metoprolol 12.5 mg q day and HCTZ 25 mg q day   5) Hyperglycemia: Serum Glc in the 200s, urine has been negative for Glc.  Likely secondary to increased dose steroids. - Continue SSI  DVT ppx: Heparin SQ 5000u currently dc'd for procedures  Dispo: - f/u PCP Dr. Tomma Lightning of [facility] 04/03/12  - f/u Rheumatology Dr. Dierdre Forth 03/28/12    LOS: 2 days   This is a Medical Student Note.  The care of the patient was discussed with Dr. Meredith Pel and the assessment and plan  formulated with their assistance.  Please see their attached note for official documentation of the daily encounter.  [name] 03/24/2012, 12:12 PM

## 2012-03-24 NOTE — Op Note (Signed)
03/22/2012 - 03/24/2012  1:13 PM  PATIENT:  [name]    PRE-OPERATIVE DIAGNOSIS:  septic left shoulder  POST-OPERATIVE DIAGNOSIS:  Same  PROCEDURE:  ARTHROSCOPY SHOULDER with extensive debridement  SURGEON:  Eulas Post, MD  PHYSICIAN ASSISTANT: Janace Litten, OPA-C, present and scrubbed throughout the case, critical for completion in a timely fashion, and for retraction, instrumentation, and closure.  ANESTHESIA:   General  PREOPERATIVE INDICATIONS:  [name] is a  46 y.o. female who presented to the hospital with left-sided shoulder pain. She is a past history of diabetes mellitus sarcoidosis and morbid obesity. The pain been going on for about a week. She upon workup had an elevated ESR and CRP as well as a mildly elevated white count. Aspiration of the shoulder joint under radiology guidance demonstrated over 65,000 white blood cells, concerning for sepsis. There no organisms on Gram stain, but there were extensive PMNs.  She also had some pain over the left proximal humerus near the biceps groove, slightly distal, that was slightly indurated, however during the course of her hospital stay this improved she said on the day of surgery is not significantly bothering her. The pain was localized to the shoulder itself. We discussed whether or not open surgical debridement of this area would be indicated, however clinically she appeared to be improved in this location.  Based on her risk factors, and elevated cell count shoulder aspirate, clinical presentation, recommendation was made for emergent surgical debridement.  The risks benefits and alternatives were discussed with the patient preoperatively including but not limited to the risks of infection, bleeding, nerve injury, cardiopulmonary complications, the need for revision surgery, among others, and the patient was willing to proceed. We also discussed the risks for osteomyelitis, the need for repeat surgical  intervention, and she was willing to proceed.  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 The biceps tendon had 20% fraying. The rotator cuff was intact, although there was synovitis and partial undersurface thickness tearing subscapularis was intact. Status is intact. The articular cartilage is intact.  OPERATIVE PROCEDURE: The patient was brought to the operating room. Gen. anesthesia was administered. She was placed in a beachchair position. The left upper extremity was prepped and draped in usual sterile fashion. She was already receiving antibiotics per protocol. Time out was performed. The arthroscopic trocar was introduced, and the shoulder was aspirated with a syringe connected to the trocar. This provided about 5 mL of turbid bloody fluid. I then performed a diagnostic arthroscopy. Is extremely difficult to identify the joint, and I would not sure whether I was in the joint or not, particularly in light of her body habitus, and so ultimately I placed the camera in from anterior, and in fact confirmed that I was in the joint all along, it was just at the joint was completely filled with fibrinous tissue making the articular cartilage nearly impossible to see. Once I cleared out the fibrous tissue, I was able to perform a normal routine diagnostic arthroscopy. The rotator cuff including the undersurface of the supraspinatus the subscapularis and it was a was coded hip and this tissue, and I debrided this gently, preserving the actual tendon itself. I also debrided biceps tendon back to a stable configuration, and 75% intact. I did not go in the subacromial space, because the articular space was already infected, and in the face of an intact rotator cuff, I did not want to contaminate the subacromial space  and potential increased risk for infection spread along tissue plane.  I irrigated and debrided and used a total of 12 L of saline.  I  then removed the arthroscopic instruments and turned my attention to the proximal arm. This felt much softer than it was last couple days. I used a spinal needle to attempt an aspiration of this area, but did not yield any significant fluid. Therefore I did not open this up. Additionally, the CT scan did not demonstrate fluid collection location, so therefore I did not open her proximal arm.  The arthroscopic portals were then closed with nylon, and Steri-Strips and sterile gauze were applied. He was awakened and returned to PACU in stable and satisfactory condition. There were no complications.

## 2012-03-24 NOTE — Clinical Documentation Improvement (Signed)
EXCISIONAL DEBRIDEMENT DOCUMENTATION CLARIFICATION  THIS DOCUMENT IS NOT A PERMANENT PART OF THE MEDICAL RECORD  TO RESPOND TO THE THIS QUERY, FOLLOW THE INSTRUCTIONS BELOW:  1. If needed, update documentation for the patient's encounter via the notes activity.  2. Access this query again and click edit on the In [employer].  3. After updating, or not, click F2 to complete all highlighted (required) fields concerning your review. Select "additional documentation in the medical record" OR "no additional documentation provided".  4. Click Sign note button.  5. The deficiency will fall out of your In Basket *Please let us know if you are not able to complete this workflow by phone or e-mail (listed below).  Please update your documentation within the medical record to reflect your response to this query.                                                                                        03/24/12   Dear [facility],  In a better effort to capture your patient's severity of illness, reflect appropriate length of stay and utilization of resources, a review of the patient medical record has revealed the following indicators.    Based on your clinical judgment, please clarify and document in a progress note and/or discharge summary the clinical condition associated with the following supporting information:  In responding to this query please exercise your independent judgment.  The fact that a query is asked, does not imply that any particular answer is desired or expected. Because that is documentation in the medical record of "debridement" clarification is needed.  Please document the below (4) key elements in a progress note:  1.  Type of Debridement:          Excisional Debridement-  Cutting away necrotic, devitalized tissue or slough to  Level of viable tissue using a sharp instrument (example, scalpel, scissors, etc).           NON Excisional Debridement-  The removal  of necrotic, devitalized tissue or slough by means of scraping, mechanical brushing, flushing, or washing. (example: irrigation, whirlpool);  Minor removal of loose fragments  2.  Please indicate instrument used:  Scissors, Scalpel, Curette, or other  3.  Please document depth of the debridement:             Partial Thickness  Full Thickness  Skin and Subcutaneous Tissue  Subcutaneous Tissue and Muscle  Subcutaneous Tissue, Muscle and Bone  Other  4.  Document the size of the debridement.  You may use possible, probable, or suspect with inpatient documentation. possible, probable, suspected diagnoses MUST be documented at the time of discharge  Reviewed: additional documentation in the medical record  Thank You,  Saul Fordyce  Clinical Documentation Specialist: [phone] Pager  Health Information Management [place]

## 2012-03-24 NOTE — Anesthesia Postprocedure Evaluation (Signed)
  Anesthesia Post-op Note  Patient: [name]  Procedure(s) Performed: Procedure(s) (LRB): ARTHROSCOPY SHOULDER (Left)  Patient Location: PACU  Anesthesia Type: General  Level of Consciousness: awake, alert  and oriented  Airway and Oxygen Therapy: Patient Spontanous Breathing and Patient connected to nasal cannula oxygen  Post-op Pain: mild  Post-op Assessment: Post-op Vital signs reviewed and Patient's Cardiovascular Status Stable  Post-op Vital Signs: stable  Complications: No apparent anesthesia complications

## 2012-03-24 NOTE — Anesthesia Preprocedure Evaluation (Signed)
Anesthesia Evaluation   Patient awake    Reviewed: Allergy & Precautions, H&P , NPO status , Patient's Chart, lab work & pertinent test results  Airway Mallampati: II      Dental  (+) Teeth Intact   Pulmonary  breath sounds clear to auscultation        Cardiovascular Rhythm:Regular Rate:Normal     Neuro/Psych    GI/Hepatic   Endo/Other    Renal/GU      Musculoskeletal   Abdominal (+) + obese,   Peds  Hematology   Anesthesia Other Findings   Reproductive/Obstetrics                           Anesthesia Physical Anesthesia Plan  ASA: III and Emergent  Anesthesia Plan: General   Post-op Pain Management:    Induction: Intravenous  Airway Management Planned: Oral ETT  Additional Equipment:   Intra-op Plan:   Post-operative Plan: Extubation in OR  Informed Consent: I have reviewed the patients History and Physical, chart, labs and discussed the procedure including the risks, benefits and alternatives for the proposed anesthesia with the patient or authorized representative who has indicated his/her understanding and acceptance.   Dental advisory given  Plan Discussed with:   Anesthesia Plan Comments: (Septic arthritis L. Shoulder Htn Sarcoidosis on prednisone Obesity  Plan GA  Kipp Brood, MD)        Anesthesia Quick Evaluation

## 2012-03-24 NOTE — Anesthesia Procedure Notes (Signed)
Procedure Name: Intubation Date/Time: 03/24/2012 11:40 AM Performed by: Gwenyth Allegra Pre-anesthesia Checklist: Patient identified, Timeout performed, Emergency Drugs available, Suction available and Patient being monitored Patient Re-evaluated:Patient Re-evaluated prior to inductionOxygen Delivery Method: Circle system utilized Preoxygenation: Pre-oxygenation with 100% oxygen Intubation Type: IV induction Ventilation: Mask ventilation without difficulty Grade View: Grade II Tube type: Oral Tube size: 7.5 mm Number of attempts: 1 Airway Equipment and Method: Stylet Placement Confirmation: ETT inserted through vocal cords under direct vision,  breath sounds checked- equal and bilateral and positive ETCO2 Secured at: 21 cm Tube secured with: Tape Dental Injury: Teeth and Oropharynx as per pre-operative assessment

## 2012-03-24 NOTE — Progress Notes (Signed)
I have reviewed the data, which demonstrated turbid fluid aspirate from the shoulder with more than 60,000 white blood cells. She has an elevated sedimentation rate and CRP as well as slight elevation in white count. I am concerned that clinically she has a picture of either substantial inflammatory response or septic joint, question septic abscess of the proximal arm. For these reasons I recommended emergent surgical arthroscopy of the left shoulder with debridement and possible open debridement of the left arm.  The risks benefits and alternatives were discussed with the patient including but not limited to the risks of nonoperative treatment, versus surgical intervention including infection, bleeding, nerve injury, the need for revision surgery, blood clots, cardiopulmonary complications, morbidity, mortality, among others, and they were willing to proceed.     Eulas Post, MD

## 2012-03-24 NOTE — Progress Notes (Signed)
Internal Medicine Attending  Date: 03/24/2012  Patient name: [name] Medical record number: [id number] Date of birth: 04-Mar-1966 Age: 46 y.o. Gender: female  I discussed patient with house staff on morning rounds.  Patient underwent arthroscopy with extensive debridement of her left shoulder today by Dr. Dion Saucier for septic arthritis.  Her joint fluid showed 65,800 WBC (99% PMNs), with no crystals seen; Gram stain showed leukocytes but no organisms.  Plan is to consult infectious disease regarding choice of antibiotics.  Dr. Aundria Rud will cover as attending physician this weekend, and I have discussed patient with Dr. Aundria Rud; Dr. Josem Kaufmann will take over as attending physician on Monday, 03/27/2012

## 2012-03-24 NOTE — Preoperative (Signed)
Beta Blockers   Reason not to administer Beta Blockers:Not Applicable 

## 2012-03-24 NOTE — Progress Notes (Signed)
Orthopedic Tech Progress Note Patient Details:  [name] [birth year] [id number] Applied overhead frame. Patient ID: [name], female   DOB: [birth year], 46 y.o.   MRN: [id number]   [name] 03/24/2012, 9:47 PM

## 2012-03-24 NOTE — Clinical Documentation Improvement (Signed)
Anemia Blood Loss Clarification  THIS DOCUMENT IS NOT A PERMANENT PART OF THE MEDICAL RECORD  RESPOND TO THE THIS QUERY, FOLLOW THE INSTRUCTIONS BELOW:  1. If needed, update documentation for the patient's encounter via the notes activity.  2. Access this query again and click edit on the In [employer].  3. After updating, or not, click F2 to complete all highlighted (required) fields concerning your review. Select "additional documentation in the medical record" OR "no additional documentation provided".  4. Click Sign note button.  5. The deficiency will fall out of your In Basket *Please let us know if you are not able to complete this workflow by phone or e-mail (listed below).        03/24/12  Dear Lollie Sails Marton Redwood  In an effort to better capture your patient's severity of illness, reflect appropriate length of stay and utilization of resources, a review of the patient medical record has revealed the following indicators.    Based on your clinical judgment, please clarify and document in a progress note and/or discharge summary the clinical condition associated with the following supporting information:  In responding to this query please exercise your independent judgment.  The fact that a query is asked, does not imply that any particular answer is desired or expected.  Possible Clinical Conditions?   " Expected Acute Blood Loss Anemia  " Acute Blood Loss Anemia " Acute on chronic blood loss anemia  " Chronic blood loss anemia  " Precipitous drop in Hematocrit  " Other Condition________________  " Cannot Clinically Determine  Risk Factors: (recent surgery, pre op anemia, EBL in OR)  Supporting Information:  Signs and Symptoms (unable to ambulate, weakness, dizziness, unable to participate in care)  Diagnostics: H&H on admit: Pre-OP H&H: Post OP H&H: Current H&H: H&H after TX: Treatments: Transfusion: IV fluids / plasma expanders: Serial H&H  monitoring Medications (Fe, Procrit)  Reviewed:  no additional documentation provided  Thank Darden Palmer  Clinical Documentation Specialist: [phone] Pager  Health Information Management [place]

## 2012-03-24 NOTE — Transfer of Care (Signed)
Immediate Anesthesia Transfer of Care Note  Patient: [name]  Procedure(s) Performed: Procedure(s) (LRB): ARTHROSCOPY SHOULDER (Left)  Patient Location: PACU  Anesthesia Type: General  Level of Consciousness: awake  Airway & Oxygen Therapy: Patient Spontanous Breathing and Patient connected to face mask oxygen  Post-op Assessment: Report given to PACU RN and Post -op Vital signs reviewed and stable  Post vital signs: Reviewed and stable  Complications: No apparent anesthesia complications

## 2012-03-25 ENCOUNTER — Other Ambulatory Visit: Payer: Self-pay

## 2012-03-25 LAB — BASIC METABOLIC PANEL
Calcium: 10.2 mg/dL (ref 8.4–10.5)
GFR calc non Af Amer: 83 mL/min — ABNORMAL LOW (ref >90)
Glucose, Bld: 169 mg/dL — ABNORMAL HIGH (ref 70–99)
Sodium: 135 mEq/L (ref 135–145)

## 2012-03-25 LAB — GLUCOSE, CAPILLARY: Glucose-Capillary: 119 mg/dL — ABNORMAL HIGH (ref 70–99)

## 2012-03-25 LAB — CBC
Hemoglobin: 11 g/dL — ABNORMAL LOW (ref 12.0–15.0)
MCH: 27.5 pg (ref 26.0–34.0)
MCHC: 32 g/dL (ref 30.0–36.0)
Platelets: 453 10*3/uL — ABNORMAL HIGH (ref 150–400)

## 2012-03-25 MED ORDER — POLYETHYLENE GLYCOL 3350 17 G PO PACK
17.0000 g | PACK | Freq: Once | ORAL | Status: AC
  Administered 2012-03-25: 17 g via ORAL
  Filled 2012-03-25: qty 1

## 2012-03-25 MED ORDER — ENOXAPARIN SODIUM 80 MG/0.8ML ~~LOC~~ SOLN
70.0000 mg | SUBCUTANEOUS | Status: DC
  Administered 2012-03-26 – 2012-03-27 (×2): 70 mg via SUBCUTANEOUS
  Filled 2012-03-25 (×3): qty 0.8

## 2012-03-25 MED ORDER — ENOXAPARIN SODIUM 40 MG/0.4ML ~~LOC~~ SOLN
70.0000 mg | SUBCUTANEOUS | Status: DC
  Filled 2012-03-25: qty 0.3

## 2012-03-25 MED ORDER — INSULIN ASPART 100 UNIT/ML ~~LOC~~ SOLN
0.0000 [IU] | Freq: Three times a day (TID) | SUBCUTANEOUS | Status: DC
  Administered 2012-03-25 – 2012-03-26 (×4): 2 [IU] via SUBCUTANEOUS
  Administered 2012-03-27 (×2): 1 [IU] via SUBCUTANEOUS

## 2012-03-25 NOTE — Progress Notes (Signed)
Patient ID: [name], female   DOB: 11-24-1966, 46 y.o.   MRN: [id number]     1 Day Post-Op   Subjective:  Patient reports pain as mild.  She states that she is doing much better.   Objective:   VITALS:   Filed Vitals:   03/24/12 1500 03/24/12 1745 03/24/12 2100 03/25/12 0603  BP:  137/74 141/83 157/93  Pulse:  84 84 79  Temp:  97.4 F (36.3 C) 97.7 F (36.5 C) 100.1 F (37.8 C)  TempSrc:  Oral Oral Oral  Resp:  18 20 20   Height: 5\' 11"  (1.803 m)     Weight: 138.347 kg (305 lb)     SpO2:  97% 97% 97%    Sensation intact distally Dorsiflexion/Plantar flexion intact Incision: dressing C/D/I and no drainage Compartment soft  LABS Lab Results  Component Value Date   HGB 11.0* 03/25/2012   HGB 12.3 03/23/2012   HGB 12.7 03/22/2012   CBC    Component Value Date/Time   WBC 14.2* 03/25/2012 0700   RBC 4.00 03/25/2012 0700   HGB 11.0* 03/25/2012 0700   HCT 34.4* 03/25/2012 0700   PLT 453* 03/25/2012 0700   MCV 86.0 03/25/2012 0700   MCH 27.5 03/25/2012 0700   MCHC 32.0 03/25/2012 0700   RDW 15.5 03/25/2012 0700   LYMPHSABS 1.6 03/22/2012 0830   MONOABS 0.9 03/22/2012 0830   EOSABS 0.4 03/22/2012 0830   BASOSABS 0.1 03/22/2012 0830    Lab Results  Component Value Date   INR 1.06 07/12/2011   Lab Results  Component Value Date   NA 135 03/25/2012   K 4.3 03/25/2012   CL 97 03/25/2012   CO2 28 03/25/2012   BUN 19 03/25/2012   CREATININE 0.83 03/25/2012   GLUCOSE 169* 03/25/2012   Ct Shoulder Left W Contrast  03/22/2012  *RADIOLOGY REPORT*  Clinical Data: Cellulitis or infection.  Fever.  CT OF THE LEFT SHOULDER WITH CONTRAST  Technique:  Multidetector CT imaging was performed following the standard protocol during bolus administration of intravenous contrast.  Contrast: OMNIPAQUE IOHEXOL 300 MG/ML  SOLN  Comparison: None.  Findings: I think there may be a small amount of fluid in the shoulder joint.  I wonder if there is some edema in the deltoid muscle, but that is  difficult to state with certainty.  There is no evidence of fracture, degenerative disease of the joint or osteomyelitis.  I cannot identify a rotator cuff tear, though sensitivity is limited.  AC joint appears unremarkable.  Other regional soft tissues appear unremarkable.  IMPRESSION: Question small amount of fluid in the shoulder joint.  Question edema in the deltoid muscle, particularly anteriorly.  These findings are debatable and nonspecific.  See above for full discussion.  Original Report Authenticated By: Thomasenia Sales, M.D.   Dg Fluoro Guide Ndl Plc/bx  03/23/2012  *RADIOLOGY REPORT*  Clinical Data: Shoulder joint effusion.  Possible septic joint. Abnormal fluid and edema along the pectoralis and deltoid musculature anteriorly.  FLUORO GUIDED LEFT SHOULDER ARTHROCENTESIS  Comparison: CT scan dated 03/22/2012  Procedure:  I discussed the risks (including hemorrhage and introducing infection into the glenohumeral joint, among others), benefits, and alternatives to fluoroscopically guided left shoulder arthrocentesis with the patient.  We discussed the good likelihood of technical success of the procedure.  She understood and elected to undergo the procedure.  Because of the findings in the anterior musculature on yesterday's CT scan, I elected for a posterior approach to the  glenohumeral joint in order to avoid contaminating the joint with any of the anterior fluid.  Following sterile skin prep and local anesthetic administration consisting of 1% lidocaine, a 6 inches 20 gauge needle was advanced into the upper portion of the left glenohumeral joint from a posterior approach.  Omnipaque-300 contrast was injected into the joint and demonstrated irregular joint contour suggesting synovitis.  Initial attempts to aspirate from the joint were unsuccessful, possibly due to blocking of the needle tip by the inflamed synovium.  After repositioning, I aspirated 11 ml of cloudy yellow fluid from the glenohumeral  joint.  The needle was subsequently removed and the skin cleansed and bandaged.  The syringe containing the aspirated fluid was labeled and sent to the lab.  No immediate complications were observed.  IMPRESSION:  1.  Technically successful fluoroscopically guided left glenohumeral joint aspiration, from posterior approach.  A total of 11 ml of cloudy yellow fluid was withdrawn from the glenohumeral joint and sent to the lab.  Original Report Authenticated By: Dellia Cloud, M.D.    Assessment/Plan: Principal Problem:  *Septic arthritis of shoulder, left Active Problems:  Sarcoidosis  Left arm cellulitis, question sarcoidosis inflammatory granulomatous reaction   Advance diet Up with therapy Continue plan per infectious diseases  Patient waiting PICC line placement   DOUGLAS PARRY, BRANDON 03/25/2012, 11:48 AM

## 2012-03-25 NOTE — Progress Notes (Signed)
Medical Student Daily Progress Note  Subjective: S/p emergent surgical arthroscopic irrigation and debridement of left shoulder joint.  No acute events overnight.  Pain management switched from percocet to dilaudid.  Pain very well controlled.  Objective: Vital signs in last 24 hours: Filed Vitals:   03/24/12 1500 03/24/12 1745 03/24/12 2100 03/25/12 0603  BP:  137/74 141/83 157/93  Pulse:  84 84 79  Temp:  97.4 F (36.3 C) 97.7 F (36.5 C) 100.1 F (37.8 C)  TempSrc:  Oral Oral Oral  Resp:  18 20 20   Height: 5\' 11"  (1.803 m)     Weight: 138.347 kg (305 lb)     SpO2:  97% 97% 97%    Intake/Output Summary (Last 24 hours) at 03/25/12 1011 Last data filed at 03/24/12 2300  Gross per 24 hour  Intake   2280 ml  Output    500 ml  Net   1780 ml   Physical Exam: - General - alert and oriented x3, lying comfortably on bed, NAD, very pleasant - Cardiac - rhythm somewhat irregular, normal rate, no murmurs, rubs, or gallops  - Lung - CTAB, no crackles or wheezes  - Abdomen - normal bowel sounds, no masses or tenderness to palpation, soft, non-distended  - Neuro - left upper extremity difficult to assess as weakness was admittedly secondary to pain, hand movements however revealed intact finger extension, thumb/index opposition, and digit abduction/adduction; otherwise CNs and motor grossly intact  - Skin - borders of indurated and warm erythema located on the medial/anterior aspect of upper left arm near axilla with interval recession within 8x5 inch pen-drawn demarcated area, very tender to palpation, blanches slightly with pressure, axillary nodes not able to palpated, anterior shoulder and axillary region bandages clean/dry/intact - Pulses - +2 pulses x4 extremities  Lab Results: Basic Metabolic Panel:  Lab 03/25/12 4098 03/23/12 0507  NA 135 135  K 4.3 4.3  CL 97 99  CO2 28 23  GLUCOSE 169* 249*  BUN 19 14  CREATININE 0.83 0.74  CALCIUM 10.2 10.3  MG -- --  PHOS -- --    CBC:  Lab 03/25/12 0700 03/23/12 0507 03/22/12 0830  WBC 14.2* 10.9* --  NEUTROABS -- -- 7.7  HGB 11.0* 12.3 --  HCT 34.4* 36.4 --  MCV 86.0 84.5 --  PLT 453* 391 --   Cardiac Enzymes:  Lab 03/23/12 1646  CKTOTAL 175  CKMB 1.7  CKMBINDEX --  TROPONINI --   CBG:  Lab 03/24/12 2151 03/24/12 1724 03/24/12 1332 03/24/12 0817 03/24/12 0015 03/23/12 2014  GLUCAP 199* 177* 133* 163* 224* 210*   Urinalysis:  Lab 03/23/12 1008 03/22/12 1801  COLORURINE YELLOW AMBER*  LABSPEC 1.027 1.033*  PHURINE 5.0 5.5  GLUCOSEU NEGATIVE NEGATIVE  HGBUR TRACE* TRACE*  BILIRUBINUR NEGATIVE SMALL*  KETONESUR NEGATIVE 15*  PROTEINUR >300* 100*  UROBILINOGEN 1.0 1.0  NITRITE NEGATIVE NEGATIVE  LEUKOCYTESUR NEGATIVE SMALL*    Micro Results: Recent Results (from the past 240 hour(s))  BODY FLUID CULTURE     Status: Normal (Preliminary result)   Collection Time   03/23/12  5:31 PM      Component Value Range Status Comment   Specimen Description SYNOVIAL FLUID SHOULDER   Final    Special Requests SYR   Final    Gram Stain     Final    Value: ABUNDANT WBC PRESENT, PREDOMINANTLY PMN     NO ORGANISMS SEEN   Culture PENDING   Incomplete  Report Status PENDING   Incomplete   BODY FLUID CULTURE     Status: Normal (Preliminary result)   Collection Time   03/24/12  1:05 PM      Component Value Range Status Comment   Specimen Description SYNOVIAL FLUID LEFT SHOULDER   Final    Special Requests FLUID ON SWAB   Final    Gram Stain     Final    Value: FEW WBC PRESENT,BOTH PMN AND MONONUCLEAR     NO SQUAMOUS EPITHELIAL CELLS SEEN     NO ORGANISMS SEEN   Culture PENDING   Incomplete    Report Status PENDING   Incomplete    Studies/Results: Dg Fluoro Guide Ndl Plc/bx  03/23/2012  *RADIOLOGY REPORT*  Clinical Data: Shoulder joint effusion.  Possible septic joint. Abnormal fluid and edema along the pectoralis and deltoid musculature anteriorly.  FLUORO GUIDED LEFT SHOULDER ARTHROCENTESIS   Comparison: CT scan dated 03/22/2012  Procedure:  I discussed the risks (including hemorrhage and introducing infection into the glenohumeral joint, among others), benefits, and alternatives to fluoroscopically guided left shoulder arthrocentesis with the patient.  We discussed the good likelihood of technical success of the procedure.  She understood and elected to undergo the procedure.  Because of the findings in the anterior musculature on yesterday's CT scan, I elected for a posterior approach to the glenohumeral joint in order to avoid contaminating the joint with any of the anterior fluid.  Following sterile skin prep and local anesthetic administration consisting of 1% lidocaine, a 6 inches 20 gauge needle was advanced into the upper portion of the left glenohumeral joint from a posterior approach.  Omnipaque-300 contrast was injected into the joint and demonstrated irregular joint contour suggesting synovitis.  Initial attempts to aspirate from the joint were unsuccessful, possibly due to blocking of the needle tip by the inflamed synovium.  After repositioning, I aspirated 11 ml of cloudy yellow fluid from the glenohumeral joint.  The needle was subsequently removed and the skin cleansed and bandaged.  The syringe containing the aspirated fluid was labeled and sent to the lab.  No immediate complications were observed.  IMPRESSION:  1.  Technically successful fluoroscopically guided left glenohumeral joint aspiration, from posterior approach.  A total of 11 ml of cloudy yellow fluid was withdrawn from the glenohumeral joint and sent to the lab.  Original Report Authenticated By: Dellia Cloud, M.D.   Medications: I have reviewed the patient's current medications. Scheduled Meds:   . cefTRIAXone (ROCEPHIN)  IV  2 g Intravenous Q24H  . docusate sodium  100 mg Oral BID  . enoxaparin  40 mg Subcutaneous Q24H  . hydrochlorothiazide  25 mg Oral Daily  . insulin aspart  0-9 Units Subcutaneous TID  WC  . methylPREDNISolone (SOLU-MEDROL) injection  60 mg Intravenous Q12H  . metoprolol tartrate  12.5 mg Oral BID  . senna  1 tablet Oral BID  . vancomycin  1,250 mg Intravenous Q12H  . vancomycin  2,500 mg Intravenous Once  . DISCONTD:  ceFAZolin (ANCEF) IV  1 g Intravenous Q6H  . DISCONTD: clindamycin (CLEOCIN) IV  600 mg Intravenous Q8H   Continuous Infusions:   . 0.45 % NaCl with KCl 20 mEq / L 75 mL/hr at 03/24/12 1620  . lactated ringers 50 mL/hr at 03/24/12 1100   PRN Meds:.acetaminophen, acetaminophen, alum & mag hydroxide-simeth, diphenhydrAMINE, HYDROmorphone, HYDROmorphone, menthol-cetylpyridinium, methocarbamol (ROBAXIN) IV, methocarbamol, metoCLOPramide (REGLAN) injection, metoCLOPramide, ondansetron (ZOFRAN) IV, ondansetron (ZOFRAN) IV, ondansetron, oxyCODONE, oxyCODONE-acetaminophen, phenol, zolpidem,  DISCONTD: 0.9 % irrigation (POUR BTL), DISCONTD: acetaminophen, DISCONTD: ondansetron (ZOFRAN) IV DISCONTD: ondansetron, DISCONTD: oxyCODONE-acetaminophen, DISCONTD: sodium chloride irrigation  Assessment/Plan: 1) Septic Joint of Left shoulder: Elevated Sed rate and CRP, synovial fluid analysis was performed 03/23/12, which revealed 11mL yellow turbid fluid, 65,800 WBCs, 99% PMNs, no crystals or anything on gram stain. Serum RF was 24. Synovial fluid results suggest inflammatory vs septic joint.  POD#1 s/p emergent arthroscopic irrigation and debridement by Dr. Dion Saucier.  Per ID consult rec's, switched from clindamycin to rocephin and vancomycin (03/24/12), regimen to continue for 6 weeks, necessitating PICC line placement. - Solumedrol 60 mg bid for inflammation  - Percocet 5 mg q 4 hours PRN for pain, if not well controlled, will consider escalating - Added dilaudid prn to pain regimen (03/25/12) - Follow up synovial fluid culture and glucose  - Follow up anti-CCP Ig   - Start vancomycin and rocephin to cover for gram positive cocci and gram neg bacilli while pending culture results.  Appreciate ID's inputs-Dr. Daiva Eves.   2) Dysuria:  Resolved.  Urinalysis (03/22/12) showed small LE, neg nitrates, 100 protein, 7-10 WBCs, but did have many squamous epithelial cells, suggestive of contaminated sample.  Repeat urinalysis (03/24/12) was negative. - Will repeat u/a and culture if symptoms return  3) Sarcoidosis: CRP 25.55, ESR 93.  - Continue solumedrol   4) HTN: - Adequately controlled with a few elevated readings likely due to pain.  - Continue metoprolol 12.5 mg q day and HCTZ 25 mg q day   5) Hyperglycemia: CBGs 133-199, improved. Likely secondary to increased dose steroids.  - Continue SSI   DVT ppx: Heparin SQ 5000u currently dc'd for procedures     LOS: 3 days   This is a Psychologist, occupational Note.  The care of the patient was discussed with Dr. Aundria Rud and the assessment and plan formulated with their assistance.  Please see their attached note for official documentation of the daily encounter.  Aleene Davidson 03/25/2012, 10:11 AM

## 2012-03-25 NOTE — Progress Notes (Signed)
Occupational Therapy Evaluation Patient Details Name: [name] MRN: [id number] DOB: Dec 08, 1966 Today's Date: 03/25/2012  Problem List:  Patient Active Problem List  Diagnoses  . Hypertension  . PVC (premature ventricular contraction)  . Bifascicular block  . Sarcoidosis  . Chest pain  . Left arm cellulitis, question sarcoidosis inflammatory granulomatous reaction  . Septic arthritis of shoulder, left    Past Medical History:  Past Medical History  Diagnosis Date  . Hypertension   . Ovarian cyst   . Fibroid tumor   . Sarcoidosis   . Complication of anesthesia     "felt like it was difficult to come out; very nauseated"  . PONV (postoperative nausea and vomiting)   . Angina   . PVC (premature ventricular contraction)   . Bifascicular block   . Arrhythmia 03/22/12    "I have an irregular heartbeat all the time"  . Left arm cellulitis, question sarcoidosis inflammatory granulomatous reaction 03/23/2012  . Septic arthritis of shoulder, left 03/24/2012   Past Surgical History:  Past Surgical History  Procedure Date  . Ovarian cyst removal 1986    "included right tube removed"  . Tubal ligation 1991  . Bladder surgery 1966    bladder/kidney surgery to reconnect tubes.  . Uterine fibroid surgery     "three since 1986"  . Tonsillectomy and adenoidectomy 1984  . Right oophorectomy 1986  . Appendectomy 1988    OT Assessment/Plan/Recommendation OT Assessment Clinical Impression Statement: Pt plans to D/C home with either mother or sister. Pt may need to follow up with outpt therapy pending progress with LUE. PT will most likely be able to complete rehab on her own. OT Recommendation/Assessment: Patient will need skilled OT in the acute care venue OT Problem List: Decreased strength;Decreased range of motion;Obesity;Impaired UE functional use;Pain;Increased edema Barriers to Discharge: None OT Therapy Diagnosis : Generalized weakness;Acute pain OT Plan OT Frequency: Min  2X/week OT Treatment/Interventions: Self-care/ADL training;Therapeutic exercise;Therapeutic activities;Patient/family education OT Recommendation Follow Up Recommendations: Outpatient OT;Other (comment) (pending progress) Equipment Recommended: None recommended by OT Individuals Consulted Consulted and Agree with Results and Recommendations: Patient OT Goals Acute Rehab OT Goals OT Goal Formulation: With patient Time For Goal Achievement: 7 days ADL Goals Pt Will Perform Upper Body Bathing: with modified independence;Sitting, chair ADL Goal: Upper Body Bathing - Progress: Goal set today Pt Will Perform Upper Body Dressing: with modified independence;Sitting, chair ADL Goal: Upper Body Dressing - Progress: Goal set today Arm Goals Pt Will Perform AROM: Left upper extremity;to maintain range of motion;Independently;2 sets (self ROM within pain tolerance) Arm Goal: AROM - Progress: Goal set today Additional Arm Goal #1: complete written HEP for LUE A/SROM indiependently. Arm Goal: Additional Goal #1 - Progress: Goal set today  OT Evaluation Precautions/Restrictions  Precautions Precautions: None Required Braces or Orthoses: Other Brace/Splint (sling PRN) Restrictions Weight Bearing Restrictions: No Prior Functioning Home Living Lives With: Family Available Help at Discharge: Family;Friend(s) Type of Home: House Home Access: Ramped entrance Home Layout: One level Bathroom Shower/Tub: Engineer, manufacturing systems: Standard Home Adaptive Equipment: Hospital bed;Shower chair with back;Bedside commode/3-in-1 Prior Function Level of Independence: Independent Able to Take Stairs?: Yes Driving: Yes Vocation: Full time employment  ADL ADL Eating/Feeding: Set up Where Assessed - Eating/Feeding: Chair Grooming: Performed;Modified independent Where Assessed - Grooming: Standing at sink Upper Body Bathing: Simulated;Minimal assistance Where Assessed - Upper Body Bathing:  Sitting, chair;Sit to stand from chair Lower Body Bathing: Simulated;Set up Where Assessed - Lower Body Bathing: Sit to stand from  chair Upper Body Dressing: Simulated;Minimal assistance Where Assessed - Upper Body Dressing: Sitting, bed;Unsupported Lower Body Dressing: Simulated;Supervision/safety;Set up Where Assessed - Lower Body Dressing: Sit to stand from bed Toilet Transfer: Performed;Independent Toilet Transfer Method: Proofreader: Regular height toilet Toileting - Clothing Manipulation: Performed;Independent Where Assessed - Toileting Clothing Manipulation: Standing Toileting - Hygiene: Performed;Independent Where Assessed - Toileting Hygiene: Standing Tub/Shower Transfer: Not assessed;Other (comment) (discussed need to clear if OK to shower with MD) Ambulation Related to ADLs: indep ADL Comments: educated pt on 1 handed techniques and alternative positions due to decreased function LUE. Vision/Perception  Vision - History Baseline Vision: No visual deficits Perception Perception: Within Functional Limits Praxis Praxis: Intact Cognition Cognition Arousal/Alertness: Awake/alert Overall Cognitive Status: Appears within functional limits for tasks assessed Orientation Level: Oriented X4 Sensation/Coordination Sensation Light Touch: Appears Intact Proprioception: Appears Intact Coordination Gross Motor Movements are Fluid and Coordinated: No Fine Motor Movements are Fluid and Coordinated: Yes Coordination and Movement Description: due to decreased L soulder ROM Extremity Assessment RUE Assessment RUE Assessment: Within Functional Limits LUE Assessment LUE Assessment: Exceptions to WFL LUE AROM (degrees) LUE Overall AROM Comments: using LUE as tolerated for ADL. not wearing sling Mobility  Bed Mobility Bed Mobility: Yes (mod I) Transfers Transfers: Yes Exercises General Exercises - Upper Extremity Shoulder Flexion: Self ROM;10  reps;Supine Elbow Flexion: Self ROM;Left;10 reps;Supine Elbow Extension: AROM;Left;10 reps;Supine End of Session OT - End of Session Activity Tolerance: Patient tolerated treatment well Patient left: in chair;with call bell in reach;with family/visitor present General Behavior During Session: [facility] for tasks performed Cognition: [facility] for tasks performed   [facility] 03/25/2012, 6:58 PM  [facility], OTR/L  [phone] 03/25/2012

## 2012-03-25 NOTE — Progress Notes (Addendum)
Resident Co-sign Daily Note: I have seen the patient and reviewed the daily progress note by  MS-4 Prince Rome and discussed the care of the patient with him.  See below for documentation of my findings, assessment, and plans.  Subjective: She feels much better today, pain is well-controlled.    Objective: Vital signs in last 24 hours: Filed Vitals:   03/24/12 1500 03/24/12 1745 03/24/12 2100 03/25/12 0603  BP:  137/74 141/83 157/93  Pulse:  84 84 79  Temp:  97.4 F (36.3 C) 97.7 F (36.5 C) 100.1 F (37.8 C)  TempSrc:  Oral Oral Oral  Resp:  18 20 20   Height: 5\' 11"  (1.803 m)     Weight: 305 lb (138.347 kg)     SpO2:  97% 97% 97%   Physical Exam: - General - alert and oriented x3, lying comfortably on bed, NAD  - Cardiac - rhythm somewhat irregular, normal rate, no murmurs, rubs, or gallops  - Lung - CTAB, no crackles or wheezes  - Abdomen - normal bowel sounds, no masses or tenderness to palpation, soft, non-distended  Left shoulder: s/p arthroscopy covered in dressing, no drainage, mild tenderness to palpation. - Neuro: nonfocal, moving all 4 extremities -Ext: no edema,+2 pulses x4 extremities  Lab Results: Reviewed and documented in Electronic Record Micro Results: Reviewed and documented in Electronic Record Studies/Results: Reviewed and documented in Electronic Record Medications: Scheduled Meds:   . cefTRIAXone (ROCEPHIN)  IV  2 g Intravenous Q24H  . docusate sodium  100 mg Oral BID  . enoxaparin  40 mg Subcutaneous Q24H  . hydrochlorothiazide  25 mg Oral Daily  . insulin aspart  0-9 Units Subcutaneous TID WC  . methylPREDNISolone (SOLU-MEDROL) injection  60 mg Intravenous Q12H  . metoprolol tartrate  12.5 mg Oral BID  . senna  1 tablet Oral BID  . vancomycin  1,250 mg Intravenous Q12H  . vancomycin  2,500 mg Intravenous Once  . DISCONTD:  ceFAZolin (ANCEF) IV  1 g Intravenous Q6H  . DISCONTD: clindamycin (CLEOCIN) IV  600 mg Intravenous Q8H   Continuous  Infusions:   . 0.45 % NaCl with KCl 20 mEq / L 75 mL/hr at 03/24/12 1620  . lactated ringers 50 mL/hr at 03/24/12 1100   PRN Meds:.acetaminophen, acetaminophen, alum & mag hydroxide-simeth, diphenhydrAMINE, HYDROmorphone, HYDROmorphone, menthol-cetylpyridinium, methocarbamol (ROBAXIN) IV, methocarbamol, metoCLOPramide (REGLAN) injection, metoCLOPramide, ondansetron (ZOFRAN) IV, ondansetron (ZOFRAN) IV, ondansetron, oxyCODONE, oxyCODONE-acetaminophen, phenol, zolpidem, DISCONTD: 0.9 % irrigation (POUR BTL), DISCONTD: acetaminophen, DISCONTD: ondansetron (ZOFRAN) IV DISCONTD: ondansetron, DISCONTD: oxyCODONE-acetaminophen, DISCONTD: sodium chloride irrigation Assessment/Plan: 1) Septic Joint of Left shoulder: Elevated Sed rate and CRP, synovial fluid analysis was performed 03/23/12, which revealed 11mL yellow turbid fluid, 65,800 WBCs, 99% PMNs, no crystals or anything on gram stain. Serum RF was 24. Synovial fluid results suggest inflammatory vs septic joint. HIV negative -s/p arthroscopy day #1  - Solumedrol 60 mg bid for inflammation  - Continue alternate with Percocet and Dilaudid PRN - Follow up synovial fluid culture and glucose  - Follow up anti-CCP Ig  - Discontinue clindamycin (03/24/12)  - Continue vancomycin and rocephin to cover for gram positive cocci and gram neg bacilli while pending culture results. Appreciate ID's inputs-Dr. Daiva Eves.   2) Dysuria:Resolved  3) Sarcoidosis: CRP 25.55, ESR 93.  - Continue solumedrol   4) HTN: - BP 157/93, adequately controlled with a few elevated readings likely due to pain.  -Continue metoprolol 12.5 mg q day and HCTZ 25 mg q day  5) Hyperglycemia: CBGs 152-199. Likely secondary to increased dose steroids.  - Continue SSI   DVT ppx: Lovenox    LOS: 3 days   [name] 03/25/2012, 12:57 PM

## 2012-03-25 NOTE — Progress Notes (Signed)
Called by RN to come and check patient with c/o chest pain/gas pains.  Instructed RN to obtain an EKG and call MD.  Upon arrival to pts room MD at bedside, EKG reviewed no changes noted from previous EKG.  Chart reviewed.  No RRT interventions at this time RN instructed to call if assistance needed

## 2012-03-25 NOTE — Progress Notes (Signed)
Resident Co-sign Daily Note: I have seen the patient and reviewed the daily progress note by  MS4- Prince Rome and discussed the care of the patient with them.  See below for documentation of my findings, assessment, and plans.  Subjective: Still c/o pain of the left shoulder, not able to sleep Objective: Vital signs in last 24 hours: Filed Vitals:   03/24/12 1500 03/24/12 1745 03/24/12 2100 03/25/12 0603  BP:  137/74 141/83 157/93  Pulse:  84 84 79  Temp:  97.4 F (36.3 C) 97.7 F (36.5 C) 100.1 F (37.8 C)  TempSrc:  Oral Oral Oral  Resp:  18 20 20   Height: 5\' 11"  (1.803 m)     Weight: 305 lb (138.347 kg)     SpO2:  97% 97% 97%   Physical Exam: - General - alert and oriented x3, lying comfortably on bed, NAD    - Cardiac - rhythm somewhat irregular, normal rate, no murmurs, rubs, or gallops  - Lung - CTAB, no crackles or wheezes  - Abdomen - normal bowel sounds, no masses or tenderness to palpation, soft, non-distended  - Neuro - left upper extremity difficult to assess as weakness was admittedly secondary to pain, hand movements however revealed intact finger extension, thumb/index opposition, and digit abduction/adduction; otherwise CNs and motor grossly intact  - Skin - borders of indurated and warm erythema located on the medial/anterior aspect of upper left arm near axilla with interval recession within 8x5 inch pen-drawn demarcated area, very tender to palpation, blanches slightly with pressure, axillary nodes not able to palpated  - Pulses - +2 pulses x4 extremities  Lab Results: Reviewed and documented in Electronic Record Micro Results: Reviewed and documented in Electronic Record Studies/Results: Reviewed and documented in Electronic Record Medications:  Scheduled Meds:   . cefTRIAXone (ROCEPHIN)  IV  2 g Intravenous Q24H  . docusate sodium  100 mg Oral BID  . enoxaparin  40 mg Subcutaneous Q24H  . hydrochlorothiazide  25 mg Oral Daily  . methylPREDNISolone  (SOLU-MEDROL) injection  60 mg Intravenous Q12H  . metoprolol tartrate  12.5 mg Oral BID  . senna  1 tablet Oral BID  . vancomycin  1,250 mg Intravenous Q12H  . vancomycin  2,500 mg Intravenous Once  . DISCONTD:  ceFAZolin (ANCEF) IV  1 g Intravenous Q6H  . DISCONTD: clindamycin (CLEOCIN) IV  600 mg Intravenous Q8H   Continuous Infusions:   . 0.45 % NaCl with KCl 20 mEq / L 75 mL/hr at 03/24/12 1620  . lactated ringers 50 mL/hr at 03/24/12 1100   PRN Meds:.acetaminophen, acetaminophen, alum & mag hydroxide-simeth, diphenhydrAMINE, HYDROmorphone, HYDROmorphone, menthol-cetylpyridinium, methocarbamol (ROBAXIN) IV, methocarbamol, metoCLOPramide (REGLAN) injection, metoCLOPramide, ondansetron (ZOFRAN) IV, ondansetron (ZOFRAN) IV, ondansetron, oxyCODONE, oxyCODONE-acetaminophen, phenol, zolpidem, DISCONTD: 0.9 % irrigation (POUR BTL), DISCONTD: acetaminophen, DISCONTD: ondansetron (ZOFRAN) IV DISCONTD: ondansetron, DISCONTD: oxyCODONE-acetaminophen, DISCONTD: sodium chloride irrigation Assessment/Plan: 1) Septic Joint of Left shoulder: Elevated Sed rate and CRP, synovial fluid analysis was performed 03/23/12, which revealed 11mL yellow turbid fluid, 65,800 WBCs, 99% PMNs, no crystals or anything on gram stain. Serum RF was 24. Synovial fluid results suggest inflammatory vs septic joint.   -Emergent arthroscopy by Dr. Dion Saucier  - Solumedrol 60 mg bid for inflammation  - Percocet 5 mg q 4 hours PRN for pain, if not well controlled, will consider escalating.  - Follow up synovial fluid culture and glucose  - Follow up anti-CCP Ig  - Discontinue clindamycin (03/24/12)  - Start vancomycin and rocephin to cover  for gram positive cocci and gram neg bacilli while pending culture results. Appreciate ID's inputs-Dr. Daiva Eves.  2) Dysuria:Urinalysis (03/22/12) showed small LE, neg nitrates, 100 protein, 7-10 WBCs, but did have many squamous epithelial cells, suggestive of contaminated sample. Will repeat u/a and  culture if she continues to have symptoms.   3) Sarcoidosis: CRP 25.55, ESR 93. - Continue solumedrol  4) HTN: - adequately controlled with a few elevated readings likely due to pain. -Continue metoprolol 12.5 mg q day and HCTZ 25 mg q day   5) Hyperglycemia: CBGs 133-199. Likely secondary to increased dose steroids.  - Continue SSI   DVT ppx: Heparin SQ 5000u currently dc'd for procedures   Dispo:  - f/u PCP Dr. Tomma Lightning of [facility] 04/03/12  - f/u Rheumatology Dr. Dierdre Forth 03/28/12       LOS: 3 days   Jonerik Sliker 03/25/2012, 8:31 AM

## 2012-03-26 ENCOUNTER — Inpatient Hospital Stay (HOSPITAL_COMMUNITY): Payer: Managed Care, Other (non HMO)

## 2012-03-26 DIAGNOSIS — M009 Pyogenic arthritis, unspecified: Principal | ICD-10-CM

## 2012-03-26 LAB — GLUCOSE, CAPILLARY
Glucose-Capillary: 169 mg/dL — ABNORMAL HIGH (ref 70–99)
Glucose-Capillary: 162 mg/dL — ABNORMAL HIGH (ref 70–99)
Glucose-Capillary: 186 mg/dL — ABNORMAL HIGH (ref 70–99)

## 2012-03-26 LAB — BODY FLUID CULTURE

## 2012-03-26 MED ORDER — BISACODYL 5 MG PO TBEC
5.0000 mg | DELAYED_RELEASE_TABLET | Freq: Every day | ORAL | Status: DC | PRN
  Filled 2012-03-26: qty 1

## 2012-03-26 MED ORDER — LISINOPRIL 5 MG PO TABS
5.0000 mg | ORAL_TABLET | Freq: Every day | ORAL | Status: DC
  Administered 2012-03-26 – 2012-03-27 (×2): 5 mg via ORAL
  Filled 2012-03-26 (×3): qty 1

## 2012-03-26 MED ORDER — PREDNISONE 50 MG PO TABS
50.0000 mg | ORAL_TABLET | Freq: Every day | ORAL | Status: DC
  Administered 2012-03-26 – 2012-03-27 (×2): 50 mg via ORAL
  Filled 2012-03-26 (×3): qty 1

## 2012-03-26 MED ORDER — SODIUM CHLORIDE 0.9 % IJ SOLN
10.0000 mL | INTRAMUSCULAR | Status: DC | PRN
Start: 2012-03-26 — End: 2012-03-27

## 2012-03-26 NOTE — Progress Notes (Signed)
Patient ID: [name], female   DOB: 03/18/1966, 46 y.o.   MRN: [id number]  INFECTIOUS DISEASE PROGRESS NOTE    Date of Admission:  03/22/2012   Total days of antibiotics 5        Day 3 vancomycin        Day 3 ceftriaxone         Principal Problem:  *Septic arthritis of shoulder, left Active Problems:  Sarcoidosis  Left arm cellulitis, question sarcoidosis inflammatory granulomatous reaction  Objective: Temp:  [97.4 F (36.3 C)-97.8 F (36.6 C)] 97.4 F (36.3 C) (04/13 2100) Pulse Rate:  [65-78] 78  (04/13 2100) Resp:  [20] 20  (04/13 2100) BP: (164-172)/(78-96) 172/96 mmHg (04/13 2100) SpO2:  [97 %] 97 % (04/13 2100)   Microbiology: Recent Results (from the past 240 hour(s))  BODY FLUID CULTURE     Status: Normal   Collection Time   03/23/12  5:31 PM      Component Value Range Status Comment   Specimen Description SYNOVIAL FLUID SHOULDER   Final    Special Requests SYR   Final    Gram Stain     Final    Value: ABUNDANT WBC PRESENT, PREDOMINANTLY PMN     NO ORGANISMS SEEN   Culture     Final    Value: FEW GROUP B STREP(S.AGALACTIAE)ISOLATED     Note: This organism is presumed to be Clindamycin resistant based on detection of inducible Clindamycin resistance.     Note: CRITICAL RESULT CALLED TO, READ BACK BY AND VERIFIED WITH: RN J. RUSSELL ON 03/25/12 BY DTERRY   Report Status 03/26/2012 FINAL   Final    Organism ID, Bacteria GROUP B STREP(S.AGALACTIAE)ISOLATED   Final   BODY FLUID CULTURE     Status: Normal (Preliminary result)   Collection Time   03/24/12  1:05 PM      Component Value Range Status Comment   Specimen Description SYNOVIAL FLUID LEFT SHOULDER   Final    Special Requests FLUID ON SWAB   Final    Gram Stain     Final    Value: FEW WBC PRESENT,BOTH PMN AND MONONUCLEAR     NO ORGANISMS SEEN   Culture Culture reincubated for better growth   Final    Report Status PENDING   Incomplete    Assessment: Operative cultures from her left shoulder that  have grown group B strep which can be treated with ceftriaxone alone.  Plan: 1. Continue ceftriaxone 2. Discontinue vancomycin   Cliffton Asters, MD [facility] [phone] pager   [phone] cell 03/26/2012, 1:34 PM

## 2012-03-26 NOTE — Progress Notes (Signed)
1657 Dr. Anselm Jungling called concerning patients temperature that was mis keyed and corrected. Patients BP was also elevated physician was notified no new orders.

## 2012-03-26 NOTE — Progress Notes (Signed)
Subjective: She is doing well except still tender in her left shoulder.  She had an episode of chest pain yesterday afternoon which completely resolved and she attributed it to gas.  She does not like Miralax for constipation, would like Ex-lax.  Objective: Vital signs in last 24 hours: Filed Vitals:   03/24/12 2100 03/25/12 0603 03/25/12 1452 03/25/12 2100  BP: 141/83 157/93 164/78 172/96  Pulse: 84 79 65 78  Temp: 97.7 F (36.5 C) 100.1 F (37.8 C) 97.8 F (36.6 C) 97.4 F (36.3 C)  TempSrc: Oral Oral Oral Oral  Resp: 20 20 20 20   Height:      Weight:      SpO2: 97% 97% 97% 97%   Weight change:   Intake/Output Summary (Last 24 hours) at 03/26/12 0858 Last data filed at 03/26/12 0500  Gross per 24 hour  Intake   1658 ml  Output      0 ml  Net   1658 ml   Physical Exam: - General - alert and oriented x3, sitting in bed eating breakfast, NAD  - Cardiac - rhythm somewhat irregular, normal rate, no murmurs, rubs, or gallops  - Lung - CTAB, no crackles or wheezes  - Abdomen - normal bowel sounds, no masses or tenderness to palpation, soft, non-distended  Left shoulder: s/p arthroscopy covered in dressing, no drainage, mild tenderness to palpation.  - Neuro: nonfocal, moving all 4 extremities  -Ext: no edema,+2 pulses x4 extremities  Lab Results: Basic Metabolic Panel:  Lab 03/25/12 9604 03/23/12 0507  NA 135 135  K 4.3 4.3  CL 97 99  CO2 28 23  GLUCOSE 169* 249*  BUN 19 14  CREATININE 0.83 0.74  CALCIUM 10.2 10.3  MG -- --  PHOS -- --   CBC:  Lab 03/25/12 0700 03/23/12 0507 03/22/12 0830  WBC 14.2* 10.9* --  NEUTROABS -- -- 7.7  HGB 11.0* 12.3 --  HCT 34.4* 36.4 --  MCV 86.0 84.5 --  PLT 453* 391 --   Cardiac Enzymes:  Lab 03/23/12 1646  CKTOTAL 175  CKMB 1.7  CKMBINDEX --  TROPONINI --  CBG:  Lab 03/26/12 0808 03/25/12 2107 03/25/12 1746 03/25/12 1244 03/24/12 2151 03/24/12 1724  GLUCAP 191* 216* 119* 152* 199* 177*   Urinalysis:  Lab 03/23/12  1008 03/22/12 1801  COLORURINE YELLOW AMBER*  LABSPEC 1.027 1.033*  PHURINE 5.0 5.5  GLUCOSEU NEGATIVE NEGATIVE  HGBUR TRACE* TRACE*  BILIRUBINUR NEGATIVE SMALL*  KETONESUR NEGATIVE 15*  PROTEINUR >300* 100*  UROBILINOGEN 1.0 1.0  NITRITE NEGATIVE NEGATIVE  LEUKOCYTESUR NEGATIVE SMALL*     Micro Results: Recent Results (from the past 240 hour(s))  BODY FLUID CULTURE     Status: Normal (Preliminary result)   Collection Time   03/23/12  5:31 PM      Component Value Range Status Comment   Specimen Description SYNOVIAL FLUID SHOULDER   Final    Special Requests SYR   Final    Gram Stain     Final    Value: ABUNDANT WBC PRESENT, PREDOMINANTLY PMN     NO ORGANISMS SEEN   Culture     Final    Value: FEW GROUP B STREP(S.AGALACTIAE)ISOLATED     Note: CRITICAL RESULT CALLED TO, READ BACK BY AND VERIFIED WITH: RN J. RUSSELL ON 03/25/12 BY DTERRY   Report Status PENDING   Incomplete   BODY FLUID CULTURE     Status: Normal (Preliminary result)   Collection Time   03/24/12  1:05 PM      Component Value Range Status Comment   Specimen Description SYNOVIAL FLUID LEFT SHOULDER   Final    Special Requests FLUID ON SWAB   Final    Gram Stain     Final    Value: FEW WBC PRESENT,BOTH PMN AND MONONUCLEAR     NO ORGANISMS SEEN   Culture Culture reincubated for better growth   Final    Report Status PENDING   Incomplete    Studies/Results: No results found. Medications:  Scheduled Meds:   . cefTRIAXone (ROCEPHIN)  IV  2 g Intravenous Q24H  . docusate sodium  100 mg Oral BID  . enoxaparin (LOVENOX) injection  70 mg Subcutaneous Q24H  . hydrochlorothiazide  25 mg Oral Daily  . insulin aspart  0-9 Units Subcutaneous TID WC  . methylPREDNISolone (SOLU-MEDROL) injection  60 mg Intravenous Q12H  . metoprolol tartrate  12.5 mg Oral BID  . polyethylene glycol  17 g Oral Once  . senna  1 tablet Oral BID  . vancomycin  1,250 mg Intravenous Q12H  . DISCONTD: enoxaparin  40 mg Subcutaneous Q24H   . DISCONTD: enoxaparin  70 mg Subcutaneous Q24H   Continuous Infusions:   . 0.45 % NaCl with KCl 20 mEq / L 75 mL/hr at 03/26/12 0427  . lactated ringers 50 mL/hr at 03/24/12 1100   PRN Meds:.acetaminophen, acetaminophen, alum & mag hydroxide-simeth, diphenhydrAMINE, HYDROmorphone, HYDROmorphone, menthol-cetylpyridinium, methocarbamol (ROBAXIN) IV, methocarbamol, metoCLOPramide (REGLAN) injection, metoCLOPramide, ondansetron (ZOFRAN) IV, ondansetron, oxyCODONE, oxyCODONE-acetaminophen, phenol, zolpidem Assessment/Plan: Principal Problem: 1)Septic arthritis of shoulder, left: post op/arthoscopy day #2.  Preliminary Culture of shoulder synovial fluid showed  FEW GROUP B STREP(S.AGALACTIAE). -change Solumedrol to Prednisone 50mg  qd - Continue alternate with Percocet and Dilaudid PRN   - Follow up anti-CCP Ig: pending - Discontinue clindamycin (03/24/12)  - Continue vancomycin and rocephin Day #3 .   2) Dysuria:Resolved   3) Sarcoidosis: CRP 25.55, ESR 93.  - Will change Prednisone 50mg  qd and then taper off to her daily dose of 10mg  daily  4) HTN: - BP not well controlled ranging from 157/93- 172/96. It is partly due to pain.  -Continue metoprolol 12.5 mg q day and HCTZ 25 mg q day  -Will add low dose ACEi to HCTZ today -Will not titrate up her BB since her pulse are in the 60-70's  5) Hyperglycemia: CBGs 119-216. Likely secondary to increased dose steroids.  - Continue SSI   6) Constipation: No BM since surgery.   -Ex-lax, Colace  7) Chest pain: likely due to GERD. Resolved.  Dispo: pending culture results and sensitivities. Patient is ok to be discharged from orthopedics' standpoint.   LOS: 4 days   Henning Ehle 03/26/2012, 8:58 AM

## 2012-03-26 NOTE — Progress Notes (Signed)
Talked to [facility] with IV team and PICC ready to use .

## 2012-03-26 NOTE — Progress Notes (Signed)
Occupational Therapy Treatment Patient Details Name: [name] MRN: [id number] DOB: 1966/06/06 Today's Date: 03/26/2012  OT Assessment/Plan OT Assessment/Plan Comments on Treatment Session: Reviewed HEP and gradually increasing use of LUE. written eduction given to pt on mgnt of LUE, including ADL, positionng and ex. Feel that pt will be able to gradually increase functional use of LUE. Educated pt on expected recovery and that if she did not feel that she was advancing with ROM and strength, then she could discuss this with her MD at follow up. OT Plan: Discharge plan remains appropriate OT Frequency: Min 2X/week Follow Up Recommendations: Outpatient OT;Other (comment) Equipment Recommended: None recommended by OT OT Goals Acute Rehab OT Goals OT Goal Formulation: With patient Time For Goal Achievement: 7 days ADL Goals Pt Will Perform Upper Body Bathing: with modified independence;Sitting, chair ADL Goal: Upper Body Bathing - Progress: Progressing toward goals Pt Will Perform Upper Body Dressing: with modified independence;Sitting, chair ADL Goal: Upper Body Dressing - Progress: Progressing toward goals Arm Goals Pt Will Perform AROM: Left upper extremity;to maintain range of motion;Independently;2 sets Arm Goal: AROM - Progress: Progressing toward goal Additional Arm Goal #1: complete written HEP for LUE A/SROM indiependently. Arm Goal: Additional Goal #1 - Progress: Progressing toward goals  OT Treatment Precautions/Restrictions  Precautions Required Braces or Orthoses: Other Brace/Splint Restrictions Weight Bearing Restrictions: No   ADL ADL Upper Body Bathing: Performed;Set up Where Assessed - Upper Body Bathing: Sitting, chair;Sitting at sink Lower Body Bathing: Performed;Modified independent Where Assessed - Lower Body Bathing: Standing at sink;Sitting at sink Upper Body Dressing: Performed;Minimal assistance;Other (comment) (A by family) Where Assessed - Upper  Body Dressing: Sitting, chair Lower Body Dressing: Simulated;Supervision/safety;Set up Toilet Transfer: Performed;Independent Toilet Transfer Method: Ambulating ADL Comments: overall S with occ min A from family Mobility    Exercises General Exercises - Upper Extremity Shoulder Flexion: Self ROM;10 reps;Supine Elbow Flexion: Self ROM;Left;10 reps;Supine Elbow Extension: AROM;Left;10 reps;Supine  End of Session OT - End of Session Activity Tolerance: Patient tolerated treatment well Patient left: in chair;Other (comment) (at sink bathing) General Behavior During Session: [facility] for tasks performed Cognition: [facility] for tasks performed  [facility]  03/26/2012, 8:51 AM [facility], OTR/L  [phone] 03/26/2012

## 2012-03-26 NOTE — Progress Notes (Signed)
Late Entry   Patient refusing PICC line placement at this time. RN discussed potential problems with patient. Rn explained to patient that  this could delay her discharge and patient still decided to wait until she could speak with physician.  0800 03-25-12

## 2012-03-26 NOTE — Progress Notes (Signed)
Orthopedic Tech Progress Note Patient Details:  CAMBRY SPAMPINATO [birth year] [id number]  Other Ortho Devices Type of Ortho Device: Other (comment) Ortho Device Interventions: Application Sling Immobilizer  Gunther Zawadzki T 03/26/2012, 1:34 PM

## 2012-03-26 NOTE — Progress Notes (Signed)
Patient ID: [name], female   DOB: 1966/01/25, 46 y.o.   MRN: [id number]     2 Days Post-Op   Subjective:  Patient reports pain as mild to moderate.  She states that she is much better and that her pain is better today than yesterday.  Denies numbness, CP or SOB.  Objective:   VITALS:   Filed Vitals:   03/24/12 2100 03/25/12 0603 03/25/12 1452 03/25/12 2100  BP: 141/83 157/93 164/78 172/96  Pulse: 84 79 65 78  Temp: 97.7 F (36.5 C) 100.1 F (37.8 C) 97.8 F (36.6 C) 97.4 F (36.3 C)  TempSrc: Oral Oral Oral Oral  Resp: 20 20 20 20   Height:      Weight:      SpO2: 97% 97% 97% 97%    Sensation intact distally Dorsiflexion/Plantar flexion intact Incision: dressing C/D/I and no drainage Dressing changed.  Wounds dry.  LABS Lab Results  Component Value Date   HGB 11.0* 03/25/2012   HGB 12.3 03/23/2012   HGB 12.7 03/22/2012   CBC    Component Value Date/Time   WBC 14.2* 03/25/2012 0700   RBC 4.00 03/25/2012 0700   HGB 11.0* 03/25/2012 0700   HCT 34.4* 03/25/2012 0700   PLT 453* 03/25/2012 0700   MCV 86.0 03/25/2012 0700   MCH 27.5 03/25/2012 0700   MCHC 32.0 03/25/2012 0700   RDW 15.5 03/25/2012 0700   LYMPHSABS 1.6 03/22/2012 0830   MONOABS 0.9 03/22/2012 0830   EOSABS 0.4 03/22/2012 0830   BASOSABS 0.1 03/22/2012 0830    Lab Results  Component Value Date   INR 1.06 07/12/2011   Lab Results  Component Value Date   NA 135 03/25/2012   K 4.3 03/25/2012   CL 97 03/25/2012   CO2 28 03/25/2012   BUN 19 03/25/2012   CREATININE 0.83 03/25/2012   GLUCOSE 169* 03/25/2012   No results found.  Culture grew out strep.  Assessment/Plan: Principal Problem:  *Septic arthritis of shoulder, left Active Problems:  Sarcoidosis  Left arm cellulitis, question sarcoidosis inflammatory granulomatous reaction   Advance diet Up with therapy Continue ABX therapy due to Post-op infection and waiting on cultures to set therapy with ID for home therapy. Abx Plan per ID  Haskel Khan 03/26/2012, 10:55 AM   Teryl Lucy, MD [phone] pager

## 2012-03-26 NOTE — Discharge Summary (Signed)
Internal Medicine Teaching [facility] Discharge Note  Name: [name] MRN: [id number] DOB: 1966-08-08 46 y.o.  Date of Admission: 03/22/2012  9:34 AM Date of Discharge: 03/26/2012 Attending Physician: Margarito Liner, MD  Discharge Diagnosis: Principal Problem:  *Septic arthritis of shoulder, left Active Problems:  Sarcoidosis  Left arm cellulitis, question sarcoidosis inflammatory granulomatous reaction HTN Constipation  Discharge Medications: Medication List  As of 03/26/2012  4:57 PM   ASK your doctor about these medications         acetaminophen 500 MG tablet   Commonly known as: TYLENOL   Take 1,000 mg by mouth every 6 (six) hours as needed. For pain.      hydrochlorothiazide 25 MG tablet   Commonly known as: HYDRODIURIL   Take 1 tablet by mouth daily.      HYDROmorphone 2 MG tablet   Commonly known as: DILAUDID   Take 2 mg by mouth every 6 (six) hours as needed. For pain      metoprolol tartrate 25 MG tablet   Commonly known as: LOPRESSOR   Take 12.5 mg by mouth 2 (two) times daily.      predniSONE 10 MG tablet   Commonly known as: DELTASONE   10 mg daily. Take as directed      terbinafine 250 MG tablet   Commonly known as: LAMISIL   Take 250 mg by mouth daily.            Disposition and follow-up:   [name] was discharged from [facility] instable and improved condition.    Follow-up Appointments: Follow-up Information    Follow up with Tomma Lightning, MD on 04/03/2012. (at 2:40 pm)    Contact information:   [address] [facility] [place] [address] [phone]       Follow up with Donnetta Hail, MD on 03/28/2012. (at 2:00pm)    Contact information:   [address], [address] [place] [address] [phone]       Follow up with Eulas Post, MD in 2 weeks.   Contact information:   [facility] [address] [place] [address] [phone]         Discharge Orders    Future Appointments: Provider: Department: Dept Phone: Center:   03/27/2012 4:15 PM Barbaraann Share, MD [facility] [phone] None   05/05/2012 3:30 PM Barbaraann Share, MD [facility] [phone] None      Consultations: Treatment Team:  Eulas Post, MD  Procedures Performed:  Ct Shoulder Left W Contrast  03/22/2012  *RADIOLOGY REPORT*  Clinical Data: Cellulitis or infection.  Fever.  CT OF THE LEFT SHOULDER WITH CONTRAST  Technique:  Multidetector CT imaging was performed following the standard protocol during bolus administration of intravenous contrast.  Contrast: OMNIPAQUE IOHEXOL 300 MG/ML  SOLN  Comparison: None.  Findings: I think there may be a small amount of fluid in the shoulder joint.  I wonder if there is some edema in the deltoid muscle, but that is difficult to state with certainty.  There is no evidence of fracture, degenerative disease of the joint or osteomyelitis.  I cannot identify a rotator cuff tear, though sensitivity is limited.  AC joint appears unremarkable.  Other regional soft tissues appear unremarkable.  IMPRESSION: Question small amount of fluid in the shoulder joint.  Question edema in the deltoid muscle, particularly anteriorly.  These  findings are debatable and nonspecific.  See above for full discussion.  Original Report Authenticated By: Thomasenia Sales, M.D.   Dg Chest Port 1 View  03/26/2012  *RADIOLOGY REPORT*  Clinical Data: Confirm line placement  PORTABLE CHEST - 1 VIEW  Comparison: The chest radiograph 11/12/2011  Findings: Interval placement of a right PICC line tip in distal SVC.  Stable enlarged heart silhouette.  No effusion, infiltrate, pneumothorax.  IMPRESSION: Placement of right PICC line without complication.  Original Report Authenticated By: Genevive Bi, M.D.   Dg Fluoro Guide Ndl Plc/bx  03/23/2012   *RADIOLOGY REPORT*  Clinical Data: Shoulder joint effusion.  Possible septic joint. Abnormal fluid and edema along the pectoralis and deltoid musculature anteriorly.  FLUORO GUIDED LEFT SHOULDER ARTHROCENTESIS  Comparison: CT scan dated 03/22/2012  Procedure:  I discussed the risks (including hemorrhage and introducing infection into the glenohumeral joint, among others), benefits, and alternatives to fluoroscopically guided left shoulder arthrocentesis with the patient.  We discussed the good likelihood of technical success of the procedure.  She understood and elected to undergo the procedure.  Because of the findings in the anterior musculature on yesterday's CT scan, I elected for a posterior approach to the glenohumeral joint in order to avoid contaminating the joint with any of the anterior fluid.  Following sterile skin prep and local anesthetic administration consisting of 1% lidocaine, a 6 inches 20 gauge needle was advanced into the upper portion of the left glenohumeral joint from a posterior approach.  Omnipaque-300 contrast was injected into the joint and demonstrated irregular joint contour suggesting synovitis.  Initial attempts to aspirate from the joint were unsuccessful, possibly due to blocking of the needle tip by the inflamed synovium.  After repositioning, I aspirated 11 ml of cloudy yellow fluid from the glenohumeral joint.  The needle was subsequently removed and the skin cleansed and bandaged.  The syringe containing the aspirated fluid was labeled and sent to the lab.  No immediate complications were observed.  IMPRESSION:  1.  Technically successful fluoroscopically guided left glenohumeral joint aspiration, from posterior approach.  A total of 11 ml of cloudy yellow fluid was withdrawn from the glenohumeral joint and sent to the lab.  Original Report Authenticated By: Dellia Cloud, M.D.   Admission HPI: [name] is a 46 year old woman with a history of sarcoidosis, HTN  presenting with a painful left upper extremity. The pain began suddenly 7 days ago (03/15/12) as a throbbing heaviness involving the upper arm, with associated achy pain involving the middle of her back and shoulder blades. She denies any trauma/injury. The pain was intermittent at first and then became continuous, eventually spreading distally to involve her entire arm. The pain at times feels sharp anteriorly along her arm. She describes a swelling sensation of her arm and weakness secondary to pain. She also noted subjective fevers and chills over the first 2 days. 5 days ago (03/17/12) [name] presented to [facility] and had radiographs and ultrasound performed. Imaging demonstrated normal humerus, some calcific tendonitis of the left rotator cuff and some undersurface osteophytes at the acromion process (a source of possible impingment), and no left upper extremity clot. She was given hydromorphone for shoulder pain and empiric ciprofloxacin for UTI. She was instructed to return to the hospital after a few days if the pain persisted or got worse for further evaluation. Thus, [name] comes today with persistent left upper extremity pain and a return of her UTI symptoms. Of note,  she has been on Prednisone 10mg  daily for her sarcoidosis and recently started to have joint pain so Dr. Shelle [name] with Pulmonology referred patient to rheumatology but she has not been evaluated by Rheumatology yet.   Hospital Course by problem list:  1)Septic arthritis of shoulder, left: Her presenting symptoms were concerning for joint infection given her very limited ROM and her elevated ESR & sed rate. Tmax the night of 03/23/12 of 100.4 with a WBC of 10.9. Patient was started on IV Clindamycin on admission x 2days and Solumedrol was started to help with inflammation.  Patient received Percocet 5mg  po q4hr alternated with Dilaudid 0.5-1mg  q2hr prn (post op) for pain control.  Ruine CG/Chlamydia, HIV were negative.  We  ordered shoulder MRI but patient was too wide to fit into the machine. Shoulder CT revealed possible small amount of fluid collection in shoulder joint and possible edema in anterior deltoid, interpreted as debatable and non-specific findings. LUE ultrasound demonstrated no thrombus. As per Orthopedic consult (Dr. Dion Saucier), shoulder joint arthrocentesis with synovial fluid analysis was performed 03/23/12, which revealed 11mL yellow turbid fluid, 65,800 WBCs, 99% PMNs, no crystals or anything on gram stain. Serum RF was 25 and ANA was positive. Synovial fluid results suggest inflammatory vs septic joint. In response to synovial fluid results, Orthopedics recommended emergent Surgical arthroscopy with extensive debridement on 03/24/12.  ID consult (Dr. Algis Liming) was concerned for septic joint and recommended discontinuation of clindamycin to prevent causing C-diff colitis and recommended extended therapy with vancomycin and rocephin, thus necessitating the eventual need for a PICC line.  Patient received 3 days of Vancomycin and Ceftriaxone while waiting for culture of her joint.  Preliminary Culture of shoulder synovial fluid showed FEW GROUP B STREP(S.AGALACTIAE) on 4/14; therefore, Vancomycin was discontinued by Dr. Orvan Falconer and will continue with IV Ceftriaxone x 6 weeks as outpatient and will need to follow up with ID Dr. Daiva Eves on 05/02/12 @ 2PM. Will need to follow up on final culture results and sensitivities to ensure that she is on appropriate antibiotics.    Will need to follow up anti-CCP Ig as outpatient  2) Dysuria: Patient had UTI 1 wk prior to admission and presented at [facility] and was treated with a course of Cipro.  She did have some dysuria on admission but symptoms gradually resolved after we began Rocephin for her Septic Joint.  Urinalysis was negative for nitrite and leukocytes, Microscopy showed few squamous epithelial and WBC 0-2, granular casts.  3) Sarcoidosis: stable.  CRP 25.55, ESR  93. Elevated CRP and ESR could be due to both sarcoidosis and septic joint.  Patient was initially started on Solumedrol as stress dose steroid for 3 days and then transitioned to Prednisone 50mg  qd and then taper off to her daily dose of 10mg  daily as outpatient.  4) HTN: BP not well controlled ranging from 157/93- 172/96 which is likely due to pain.  Home BP meds were continued including metoprolol 12.5 mg q day and HCTZ 25 mg q day.  A  low dose ACEi was added to her regimen and BMPs were monitored.  We did not titrate up her BB since her pulse were in the 60-70's.   5) Hyperglycemia: CBGs 119-216. Likely secondary to increased dose steroids. Patient was treated with SSI.  6) Constipation: Secondary to narcotics. Patient was given Ex-lax, Colace.  7) Chest pain: likely due to GERD. Resolved.   Discharge Vitals:  BP 173/104  Pulse 63  Temp(Src) 98.3 F (36.8 C) (  Oral)  Resp 20  Ht 5\' 11"  (1.803 m)  Wt 305 lb (138.347 kg)  BMI 42.54 kg/m2  SpO2 98%  LMP 10/14/2011  Discharge Labs:  Results for orders placed during the hospital encounter of 03/22/12 (from the past 24 hour(s))  GLUCOSE, CAPILLARY     Status: Abnormal   Collection Time   03/25/12  5:46 PM      Component Value Range   Glucose-Capillary 119 (*) 70 - 99 (mg/dL)   Comment 1 Documented in Chart     Comment 2 Notify RN    GLUCOSE, CAPILLARY     Status: Abnormal   Collection Time   03/25/12  9:07 PM      Component Value Range   Glucose-Capillary 216 (*) 70 - 99 (mg/dL)  GLUCOSE, CAPILLARY     Status: Abnormal   Collection Time   03/26/12  8:08 AM      Component Value Range   Glucose-Capillary 191 (*) 70 - 99 (mg/dL)  GLUCOSE, CAPILLARY     Status: Abnormal   Collection Time   03/26/12  1:21 PM      Component Value Range   Glucose-Capillary 169 (*) 70 - 99 (mg/dL)    Signed: Jonthan Leite 03/26/2012, 4:57 PM   Time Spent on Discharge: >60 minutes

## 2012-03-27 ENCOUNTER — Ambulatory Visit: Payer: Managed Care, Other (non HMO) | Admitting: Pulmonary Disease

## 2012-03-27 ENCOUNTER — Encounter (HOSPITAL_COMMUNITY): Payer: Self-pay | Admitting: Orthopedic Surgery

## 2012-03-27 LAB — GLUCOSE, CAPILLARY: Glucose-Capillary: 138 mg/dL — ABNORMAL HIGH (ref 70–99)

## 2012-03-27 MED ORDER — HEPARIN SOD (PORK) LOCK FLUSH 100 UNIT/ML IV SOLN
250.0000 [IU] | INTRAVENOUS | Status: AC | PRN
  Administered 2012-03-27: 250 [IU]

## 2012-03-27 MED ORDER — ACETAMINOPHEN 325 MG PO TABS: ORAL_TABLET | ORAL | Status: DC

## 2012-03-27 MED ORDER — PREDNISONE 10 MG PO TABS: ORAL_TABLET | ORAL | Status: AC

## 2012-03-27 MED ORDER — DEXTROSE 5 % IV SOLN: INTRAVENOUS | Status: DC

## 2012-03-27 MED ORDER — OXYCODONE-ACETAMINOPHEN 5-325 MG PO TABS: 1.0000 | ORAL_TABLET | ORAL | Status: AC | PRN

## 2012-03-27 MED ORDER — BISACODYL 5 MG PO TBEC: 5.0000 mg | DELAYED_RELEASE_TABLET | Freq: Every day | ORAL | Status: DC | PRN

## 2012-03-27 MED ORDER — LISINOPRIL 5 MG PO TABS: 5.0000 mg | ORAL_TABLET | Freq: Every day | ORAL | Status: DC

## 2012-03-27 NOTE — Progress Notes (Signed)
Pt. Got d/c instructions and prescriptions,pt. waiting for her PICC line to be lock.then pt. Will go home

## 2012-03-27 NOTE — Progress Notes (Signed)
Patient ID: [name], female   DOB: Apr 11, 1966, 46 y.o.   MRN: [id number]     3 Days Post-Op   Subjective:  Patient reports pain as mild.  She states that she is a little more sore today but she still feels better.  Objective:   VITALS:   Filed Vitals:   03/26/12 1542 03/26/12 2232 03/26/12 2235 03/27/12 0449  BP: 173/104 157/86  158/86  Pulse: 63 59 65 61  Temp: 98.3 F (36.8 C) 98.3 F (36.8 C)  97.8 F (36.6 C)  TempSrc: Oral Oral  Oral  Resp: 20 18  20   Height:      Weight:      SpO2: 98% 96%  98%    Sensation intact distally Dorsiflexion/Plantar flexion intact Incision: scant drainage Dressing tattered and removed wounds clean and dry no sign of infection dry dressing placed over the wounds.  LABS Lab Results  Component Value Date   HGB 11.0* 03/25/2012   HGB 12.3 03/23/2012   HGB 12.7 03/22/2012   CBC    Component Value Date/Time   WBC 14.2* 03/25/2012 0700   RBC 4.00 03/25/2012 0700   HGB 11.0* 03/25/2012 0700   HCT 34.4* 03/25/2012 0700   PLT 453* 03/25/2012 0700   MCV 86.0 03/25/2012 0700   MCH 27.5 03/25/2012 0700   MCHC 32.0 03/25/2012 0700   RDW 15.5 03/25/2012 0700   LYMPHSABS 1.6 03/22/2012 0830   MONOABS 0.9 03/22/2012 0830   EOSABS 0.4 03/22/2012 0830   BASOSABS 0.1 03/22/2012 0830    Lab Results  Component Value Date   INR 1.06 07/12/2011   Lab Results  Component Value Date   NA 135 03/25/2012   K 4.3 03/25/2012   CL 97 03/25/2012   CO2 28 03/25/2012   BUN 19 03/25/2012   CREATININE 0.83 03/25/2012   GLUCOSE 169* 03/25/2012   Dg Chest Port 1 View  03/26/2012  *RADIOLOGY REPORT*  Clinical Data: Confirm line placement  PORTABLE CHEST - 1 VIEW  Comparison: The chest radiograph 11/12/2011  Findings: Interval placement of a right PICC line tip in distal SVC.  Stable enlarged heart silhouette.  No effusion, infiltrate, pneumothorax.  IMPRESSION: Placement of right PICC line without complication.  Original Report Authenticated By: Genevive Bi, M.D.      Assessment/Plan: Principal Problem:  *Septic arthritis of shoulder, left Active Problems:  Sarcoidosis  Left arm cellulitis, question sarcoidosis inflammatory granulomatous reaction   Advance diet Discharge home with home health when appropriate with ID  WBAT left upper ext. Dry dressing change PRN   Haskel Khan 03/27/2012, 8:08 AM   Teryl Lucy, MD [phone] pager

## 2012-03-27 NOTE — Progress Notes (Signed)
PICC line capped off, flushed with 10cc NS, GBR.  Followed by heparin 250 units.  Pt informed that her PICC should remain at the 1cm mark and should not be pushed in or pulled out any further. States understanding. [name]

## 2012-03-27 NOTE — Progress Notes (Signed)
Medical Student Daily Progress Note  Subjective: No acute events overnight.  Patient pleasant, feeling much better, ready to go home.  Objective: Vital signs in last 24 hours: Filed Vitals:   03/26/12 1542 03/26/12 2232 03/26/12 2235 03/27/12 0449  BP: 173/104 157/86  158/86  Pulse: 63 59 65 61  Temp: 98.3 F (36.8 C) 98.3 F (36.8 C)  97.8 F (36.6 C)  TempSrc: Oral Oral  Oral  Resp: 20 18  20   Height:      Weight:      SpO2: 98% 96%  98%    Intake/Output Summary (Last 24 hours) at 03/27/12 1051 Last data filed at 03/27/12 0500  Gross per 24 hour  Intake    723 ml  Output      0 ml  Net    723 ml   Physical Exam: - General - alert and oriented x3, sitting in bed eating breakfast, NAD  - Cardiac - rhythm somewhat irregular, normal rate, no murmurs, rubs, or gallops  - Lung - CTAB, no crackles or wheezes  - Abdomen - normal bowel sounds, no masses or tenderness to palpation, soft, non-distended  Left shoulder: s/p arthroscopy with a few bandages, clean/dry/intact, mild tenderness to palpation.  - Neuro: nonfocal, moving all 4 extremities  -Ext: no edema,+2 pulses x4 extremities  Lab Results: Basic Metabolic Panel:  Lab 03/25/12 1610 03/23/12 0507  NA 135 135  K 4.3 4.3  CL 97 99  CO2 28 23  GLUCOSE 169* 249*  BUN 19 14  CREATININE 0.83 0.74  CALCIUM 10.2 10.3  MG -- --  PHOS -- --   CBC:  Lab 03/25/12 0700 03/23/12 0507 03/22/12 0830  WBC 14.2* 10.9* --  NEUTROABS -- -- 7.7  HGB 11.0* 12.3 --  HCT 34.4* 36.4 --  MCV 86.0 84.5 --  PLT 453* 391 --   Cardiac Enzymes:  Lab 03/23/12 1646  CKTOTAL 175  CKMB 1.7  CKMBINDEX --  TROPONINI --   CBG:  Lab 03/27/12 0805 03/26/12 2215 03/26/12 1707 03/26/12 1321 03/26/12 0808 03/25/12 2107  GLUCAP 138* 186* 162* 169* 191* 216*   Urinalysis:  Lab 03/23/12 1008 03/22/12 1801  COLORURINE YELLOW AMBER*  LABSPEC 1.027 1.033*  PHURINE 5.0 5.5  GLUCOSEU NEGATIVE NEGATIVE  HGBUR TRACE* TRACE*  BILIRUBINUR  NEGATIVE SMALL*  KETONESUR NEGATIVE 15*  PROTEINUR >300* 100*  UROBILINOGEN 1.0 1.0  NITRITE NEGATIVE NEGATIVE  LEUKOCYTESUR NEGATIVE SMALL*    Micro Results: Recent Results (from the past 240 hour(s))  BODY FLUID CULTURE     Status: Normal   Collection Time   03/23/12  5:31 PM      Component Value Range Status Comment   Specimen Description SYNOVIAL FLUID SHOULDER   Final    Special Requests SYR   Final    Gram Stain     Final    Value: ABUNDANT WBC PRESENT, PREDOMINANTLY PMN     NO ORGANISMS SEEN   Culture     Final    Value: FEW GROUP B STREP(S.AGALACTIAE)ISOLATED     Note: This organism is presumed to be Clindamycin resistant based on detection of inducible Clindamycin resistance.     Note: CRITICAL RESULT CALLED TO, READ BACK BY AND VERIFIED WITH: RN J. RUSSELL ON 03/25/12 BY DTERRY   Report Status 03/26/2012 FINAL   Final    Organism ID, Bacteria GROUP B STREP(S.AGALACTIAE)ISOLATED   Final   BODY FLUID CULTURE     Status: Normal   Collection  Time   03/24/12  1:05 PM      Component Value Range Status Comment   Specimen Description SYNOVIAL FLUID LEFT SHOULDER   Final    Special Requests FLUID ON SWAB   Final    Gram Stain     Final    Value: FEW WBC PRESENT,BOTH PMN AND MONONUCLEAR     NO ORGANISMS SEEN   Culture     Final    Value: FEW GROUP B STREP(S.AGALACTIAE)ISOLATED     Note: SUSCEPTIBILITIES PERFORMED ON PREVIOUS CULTURE WITHIN THE LAST 5 DAYS.     Note: CRITICAL RESULT CALLED TO, READ BACK BY AND VERIFIED WITH: RN J. RUSSELL ON 03/26/12 BY DTERRY   Report Status 03/26/2012 FINAL   Final    Studies/Results: Dg Chest Port 1 View  03/26/2012  *RADIOLOGY REPORT*  Clinical Data: Confirm line placement  PORTABLE CHEST - 1 VIEW  Comparison: The chest radiograph 11/12/2011  Findings: Interval placement of a right PICC line tip in distal SVC.  Stable enlarged heart silhouette.  No effusion, infiltrate, pneumothorax.  IMPRESSION: Placement of right PICC line without  complication.  Original Report Authenticated By: Genevive Bi, M.D.   Medications: I have reviewed the patient's current medications. Scheduled Meds:   . cefTRIAXone (ROCEPHIN)  IV  2 g Intravenous Q24H  . docusate sodium  100 mg Oral BID  . enoxaparin (LOVENOX) injection  70 mg Subcutaneous Q24H  . hydrochlorothiazide  25 mg Oral Daily  . insulin aspart  0-9 Units Subcutaneous TID WC  . lisinopril  5 mg Oral Daily  . metoprolol tartrate  12.5 mg Oral BID  . predniSONE  50 mg Oral Q breakfast  . DISCONTD: vancomycin  1,250 mg Intravenous Q12H   PRN Meds:.acetaminophen, acetaminophen, alum & mag hydroxide-simeth, bisacodyl, diphenhydrAMINE, HYDROmorphone, HYDROmorphone, menthol-cetylpyridinium, methocarbamol (ROBAXIN) IV, methocarbamol, metoCLOPramide (REGLAN) injection, metoCLOPramide, ondansetron (ZOFRAN) IV, ondansetron, oxyCODONE, oxyCODONE-acetaminophen, phenol, sodium chloride, zolpidem  Assessment/Plan: 1)Septic arthritis of shoulder, left: Status post day #3 emergent arthoscopy with irrigation/debridement.  Pain well-controlled, decreased swelling on exam.  Preliminary Culture of shoulder synovial fluid showed FEW GROUP B STREP(S.AGALACTIAE).  - Change Solumedrol to Prednisone 50mg  qd, will plan continue steroid taper down to home dose of prednisone 10mg  qd - Continue Percocet as home pain regimen - Follow up anti-CCP Ig: pending  - Discontinued clindamycin (03/24/12) and vanc (03/27/12) per ID rec's - Continue rocephin Day #4, PICC line in place for 6 weeks course as outpatient - Follow up culture sensativities  2) Dysuria: Resolved   3) Sarcoidosis: Currently no dyspnea or crackles.  CRP 25.55, ESR 93.  - Will change Prednisone 50mg  qd and then taper off to her daily dose of 10mg  daily   4) HTN: BP not well controlled. It is partly due to pain.  - Continue metoprolol 12.5 mg q day and HCTZ 25 mg q day  - Continue low dose ACEi to HCTZ - Will not titrate up her BB since her  pulse are in the 60-70's   5) Hyperglycemia: CBGs 161-096. Likely secondary to increased dose steroids.  - Continue SSI   6) Constipation: 2 BMs yesterday (POD #3).  - Ex-lax & Colace prn  7) Chest pain: Likely due to GERD. Resolved.   Dispo: Condition improved.  Patient is ok to be discharged from orthopedics' standpoint.   PICC line in place to continue rocephin as outpatient for 6 weeks.  Will taper steroids to home dose.  Outpatient follow ups with PCP, Rheumatologist, ID, and  Orthopedics.  LOS: 4 days    LOS: 5 days   This is a Psychologist, occupational Note.  The care of the patient was discussed with Dr. Josem Kaufmann and the assessment and plan formulated with their assistance.  Please see their attached note for official documentation of the daily encounter.  Aleene Davidson 03/27/2012, 10:51 AM

## 2012-03-27 NOTE — Progress Notes (Signed)
Internal Medicine Attending  Date: 03/27/2012  Patient name: [name] Medical record number: [id number] Date of birth: 1966-08-17 Age: 46 y.o. Gender: female  I saw and evaluated the patient. I reviewed the resident's note by Dr. Anselm Jungling and I agree with the resident's findings and plans as documented in progress note.  [name] feels her shoulder has improved postoperatively and she is tolerating the antibiotics well. I agree with housestaff's plan to discharge her home today with followup as outlined in Dr. Christie Nottingham progress note.

## 2012-03-27 NOTE — Progress Notes (Signed)
Patient ID: [name], female   DOB: [birth year], 46 y.o.   MRN: [id number]  INFECTIOUS DISEASE PROGRESS NOTE    Date of Admission:  03/22/2012  v        Day 4 ceftriaxone         Principal Problem:  *Septic arthritis of shoulder, left Active Problems:  Sarcoidosis  Left arm cellulitis, question sarcoidosis inflammatory granulomatous reaction  Objective: Temp:  [97.8 F (36.6 C)-98.3 F (36.8 C)] 97.8 F (36.6 C) (04/15 0449) Pulse Rate:  [59-65] 61  (04/15 0449) Resp:  [18-20] 20  (04/15 0449) BP: (157-173)/(86-104) 158/86 mmHg (04/15 0449) SpO2:  [96 %-98 %] 98 % (04/15 0449)  Sitting comfortably in bed. Shoulder no longer in sling Microbiology: Recent Results (from the past 240 hour(s))  BODY FLUID CULTURE     Status: Normal   Collection Time   03/23/12  5:31 PM      Component Value Range Status Comment   Specimen Description SYNOVIAL FLUID SHOULDER   Final    Special Requests SYR   Final    Gram Stain     Final    Value: ABUNDANT WBC PRESENT, PREDOMINANTLY PMN     NO ORGANISMS SEEN   Culture     Final    Value: FEW GROUP B STREP(S.AGALACTIAE)ISOLATED     Note: This organism is presumed to be Clindamycin resistant based on detection of inducible Clindamycin resistance.     Note: CRITICAL RESULT CALLED TO, READ BACK BY AND VERIFIED WITH: RN J. RUSSELL ON 03/25/12 BY DTERRY   Report Status 03/26/2012 FINAL   Final    Organism ID, Bacteria GROUP B STREP(S.AGALACTIAE)ISOLATED   Final   BODY FLUID CULTURE     Status: Normal   Collection Time   03/24/12  1:05 PM      Component Value Range Status Comment   Specimen Description SYNOVIAL FLUID LEFT SHOULDER   Final    Special Requests FLUID ON SWAB   Final    Gram Stain     Final    Value: FEW WBC PRESENT,BOTH PMN AND MONONUCLEAR     NO ORGANISMS SEEN   Culture     Final    Value: FEW GROUP B STREP(S.AGALACTIAE)ISOLATED     Note: SUSCEPTIBILITIES PERFORMED ON PREVIOUS CULTURE WITHIN THE LAST 5 DAYS.     Note: CRITICAL  RESULT CALLED TO, READ BACK BY AND VERIFIED WITH: RN J. RUSSELL ON 03/26/12 BY DTERRY   Report Status 03/26/2012 FINAL   Final    Assessment: Operative cultures from her left shoulder that have grown group B strep which can be treated with ceftriaxone alone.  Plan: 1. Continue ceftriaxone 2 grams daily for plan of 6 weeks 2. followup in [facility] in next 4 weeks   Acey Lav, MD [facility] [phone] pager   [phone] cell 03/27/2012, 12:52 PM

## 2012-03-27 NOTE — Progress Notes (Signed)
Resident Co-sign Daily Note: I have seen the patient and reviewed the daily progress note by MS 4, Prince Rome and discussed the care of the patient with them.  See below for documentation of my findings, assessment, and plans.  Subjective: Doing well this morning, pain is better controlled, excited to go home.  Objective: Vital signs in last 24 hours: Filed Vitals:   03/26/12 2232 03/26/12 2235 03/27/12 0449 03/27/12 1537  BP: 157/86  158/86 134/75  Pulse: 59 65 61 69  Temp: 98.3 F (36.8 C)  97.8 F (36.6 C) 98.5 F (36.9 C)  TempSrc: Oral  Oral Oral  Resp: 18  20 20   Height:      Weight:      SpO2: 96%  98% 97%   Physical Exam: -General - alert and oriented x3, sitting in bed eating breakfast, NAD  - Cardiac - rhythm somewhat irregular, normal rate, no murmurs, rubs, or gallops  - Lung - CTAB, no crackles or wheezes  - Abdomen - normal bowel sounds, no masses or tenderness to palpation, soft, non-distended  Left shoulder: s/p arthroscopy covered 2 bandages at 2 different site, no drainage, mild tenderness to palpation.  - Neuro: nonfocal, moving all 4 extremities  -Ext: no edema,+2 pulses x4 extremities Lab Results: Reviewed and documented in Electronic Record Micro Results: Reviewed and documented in Electronic Record Studies/Results: Reviewed and documented in Electronic Record Medications: Scheduled Meds:   . cefTRIAXone (ROCEPHIN)  IV  2 g Intravenous Q24H  . docusate sodium  100 mg Oral BID  . enoxaparin (LOVENOX) injection  70 mg Subcutaneous Q24H  . hydrochlorothiazide  25 mg Oral Daily  . insulin aspart  0-9 Units Subcutaneous TID WC  . lisinopril  5 mg Oral Daily  . metoprolol tartrate  12.5 mg Oral BID  . predniSONE  50 mg Oral Q breakfast   Continuous Infusions:  PRN Meds:.acetaminophen, acetaminophen, alum & mag hydroxide-simeth, bisacodyl, diphenhydrAMINE, HYDROmorphone, HYDROmorphone, menthol-cetylpyridinium, methocarbamol (ROBAXIN) IV, methocarbamol,  metoCLOPramide (REGLAN) injection, metoCLOPramide, ondansetron (ZOFRAN) IV, ondansetron, oxyCODONE, oxyCODONE-acetaminophen, phenol, sodium chloride, zolpidem Assessment/Plan: 1)Septic arthritis of shoulder, left: post op/arthoscopy day #2. Preliminary Culture of shoulder synovial fluid showed FEW GROUP B STREP(S.AGALACTIAE).  - Prednisone 50mg  qd , taper - Continue alternate with Percocet  - Follow up anti-CCP Ig: pending  - Discontinue clindamycin (03/24/12)  - Continue  rocephin Day #4 , will continue for 6 weeks of home IV abx and then follow up with Dr. Daiva Eves on May 21st @ 2PM.   2) Dysuria:Resolved   3) Sarcoidosis: CRP 25.55, ESR 93.  -  Prednisone 50mg  qd taper 4) HTN: - BP not well controlled ranging from 134/75-173/104. It is partly due to pain.  -Continue metoprolol 12.5 mg q day and HCTZ 25 mg q day  -Continue Lisinopril 5mg  qd -Will not titrate up her BB since her pulse are in the 60-70's   5) Hyperglycemia: CBGs 130-180's. Likely secondary to increased dose steroids.  - Continue SSI   6) Constipation: Ex-lax, Colace   7) Chest pain: likely due to GERD. Resolved.  Dispo: pending culture results and sensitivities. Patient is ok to be discharged from orthopedics' standpoint.  Dispo: home today   LOS: 5 days   Kacee Sukhu 03/27/2012, 4:28 PM

## 2012-03-28 NOTE — Progress Notes (Signed)
Late Entry:     CARE MANAGEMENT NOTE 03/28/2012  Patient:  KALLE, BERNATH   Account Number:  [id number]  Date Initiated:  03/27/2012  Documentation initiated by:  Ronny Flurry  Subjective/Objective Assessment:   Met with pt and mother re HH IV antibiotics.     Action/Plan:   HH for [facility], where pt will d/c to her Mothers home and [facility] will be provided by [facility] and IV by [employer]   Anticipated DC Date:  03/31/2012   Anticipated DC Plan:  HOME W HOME HEALTH SERVICES         [facility] Choice  HOME HEALTH   Choice offered to / List presented to:  C-1 Patient           Status of service:  Completed, signed off Medicare Important Message given?   (If response is "NO", the following Medicare IM given date fields will be blank) Date Medicare IM given:   Date Additional Medicare IM given:    Discharge Disposition:  HOME W HOME HEALTH SERVICES  Per UR Regulation:  Reviewed for med. necessity/level of care/duration of stay  If discussed at Long Length of Stay Meetings, dates discussed:    Comments:  03/27/2012 Pt to d/c to [place], [place], [facility] arranged with [facility] [place] , [place]. IV Rocephin to provided by [facility] [place]. [employer] RN MPH Case Manager [phone]

## 2012-04-14 NOTE — Discharge Summary (Signed)
Please note that the attending at discharge was Rocco Serene and the discharge date was March 27, 2012.

## 2012-04-16 ENCOUNTER — Telehealth: Payer: Self-pay | Admitting: Internal Medicine

## 2012-04-16 ENCOUNTER — Other Ambulatory Visit: Payer: Self-pay | Admitting: Internal Medicine

## 2012-04-16 MED ORDER — HYDROCODONE-ACETAMINOPHEN 7.5-300 MG PO TABS: 1.0000 | ORAL_TABLET | Freq: Three times a day (TID) | ORAL | Status: DC | PRN

## 2012-04-16 NOTE — Progress Notes (Signed)
Patient called stating that she is still in pain from her left shoulder s/p septic arthritis arthroscopy on 4/12 with extensive debridement.  She followed up with her PCP and orthopedist 2 weeks ago but did not ask for pain med because she still had some at home.  She states that she has an appointment on 5/15 with orthopedics and also with [facility] in 1 week since she wont be able to see her PCP for 2 months.  She has been taking Percocet 5mg  4 times daily when pain is at its worst.  She was sent home from the hospital with Percocet; however, it was not pulled into her med list on EPIC and had Dilaudid on her discharge summary (we did not send her home with Dilaudid).   I did take care of patient in the hospital and her pain was severe while she was in hospital.  I will call in Vicodin 7.5mg  q 8hr prn #30 with no RF at [facility] in [place] at [phone] and patient is to be re-evaluated at follow up with [facility] and or Orthopedics.  I informed patient that this is a ONE time Rx only and will not refill more in the future unless she see one of the physicians in our clinic and she verbalized her understanding.

## 2012-04-16 NOTE — Telephone Encounter (Signed)
  INTERNAL MEDICINE RESIDENCY PROGRAM After-Hours Telephone Call    Reason for call:   I received a call from [name] indicating she is having persistent pain and needs refills of her pain medication.    Last clinic/ hospital encounter:   April 2013 - Last seen in the Hospital by Dr. Anselm Jungling and associates for evaluation of septic arthritis of the left shoulder, was sent home with oral pain medications, states she is on percocet.    Assessment / Plan:   I am unclear what narcotic she should be be on at this point, as pt states she is on percocet and dc summary says dilaudid (which pt states she got in another ER, but was only administered in the ER, not given a prescription)  I advised the patient that we do not prescribe narcotics over the phone over the weekend when clinic is closed. Therefore, she will need to have her concern addressed during business hours.   I will confer with Dr. Anselm Jungling regarding her pain medication regimen.  As always, pt is advised that if symptoms worsen or new symptoms arise, they should go to an urgent care facility or to to ER for further evaluation.    [name], D.O.  04/16/2012, 12:25 PM

## 2012-04-26 ENCOUNTER — Other Ambulatory Visit: Payer: Managed Care, Other (non HMO)

## 2012-04-26 ENCOUNTER — Other Ambulatory Visit: Payer: Self-pay | Admitting: Infectious Disease

## 2012-04-26 ENCOUNTER — Telehealth: Payer: Self-pay | Admitting: *Deleted

## 2012-04-26 ENCOUNTER — Inpatient Hospital Stay
Admission: RE | Admit: 2012-04-26 | Discharge: 2012-04-26 | Payer: Managed Care, Other (non HMO) | Source: Ambulatory Visit | Attending: Infectious Disease | Admitting: Infectious Disease

## 2012-04-26 ENCOUNTER — Emergency Department (HOSPITAL_COMMUNITY)
Admission: EM | Admit: 2012-04-26 | Discharge: 2012-04-27 | Discharge status: Against Medical Advice | Payer: Managed Care, Other (non HMO) | Attending: Emergency Medicine | Admitting: Emergency Medicine

## 2012-04-26 ENCOUNTER — Ambulatory Visit
Admission: RE | Admit: 2012-04-26 | Discharge: 2012-04-26 | Disposition: A | Discharge status: Home / Self Care | Payer: Managed Care, Other (non HMO) | Source: Ambulatory Visit | Attending: Infectious Disease | Admitting: Infectious Disease

## 2012-04-26 ENCOUNTER — Ambulatory Visit (INDEPENDENT_AMBULATORY_CARE_PROVIDER_SITE_OTHER): Payer: Managed Care, Other (non HMO) | Admitting: Infectious Disease

## 2012-04-26 ENCOUNTER — Encounter (HOSPITAL_COMMUNITY): Payer: Self-pay | Admitting: *Deleted

## 2012-04-26 ENCOUNTER — Ambulatory Visit: Admission: RE | Admit: 2012-04-26 | Payer: Managed Care, Other (non HMO) | Source: Ambulatory Visit

## 2012-04-26 VITALS — BP 145/102 | Temp 98.1°F | Ht 71.0 in | Wt 298.0 lb

## 2012-04-26 DIAGNOSIS — M009 Pyogenic arthritis, unspecified: Secondary | ICD-10-CM

## 2012-04-26 DIAGNOSIS — L03114 Cellulitis of left upper limb: Secondary | ICD-10-CM

## 2012-04-26 DIAGNOSIS — IMO0002 Reserved for concepts with insufficient information to code with codable children: Secondary | ICD-10-CM

## 2012-04-26 DIAGNOSIS — Z0389 Encounter for observation for other suspected diseases and conditions ruled out: Secondary | ICD-10-CM | POA: Insufficient documentation

## 2012-04-26 LAB — CBC WITH DIFFERENTIAL/PLATELET
Eosinophils Absolute: 0.2 10*3/uL (ref 0.0–0.7)
Hemoglobin: 12.7 g/dL (ref 12.0–15.0)
Lymphs Abs: 2.2 10*3/uL (ref 0.7–4.0)
MCH: 28.1 pg (ref 26.0–34.0)
Monocytes Relative: 10 % (ref 3–12)
Neutrophils Relative %: 61 % (ref 43–77)
RBC: 4.52 MIL/uL (ref 3.87–5.11)

## 2012-04-26 LAB — COMPLETE METABOLIC PANEL WITH GFR
Albumin: 4.2 g/dL (ref 3.5–5.2)
CO2: 29 mEq/L (ref 19–32)
Calcium: 10.9 mg/dL — ABNORMAL HIGH (ref 8.4–10.5)
GFR, Est African American: 89 mL/min
GFR, Est Non African American: 77 mL/min
Glucose, Bld: 81 mg/dL (ref 70–99)
Potassium: 3.9 mEq/L (ref 3.5–5.3)
Sodium: 142 mEq/L (ref 135–145)
Total Bilirubin: 0.2 mg/dL — ABNORMAL LOW (ref 0.3–1.2)
Total Protein: 8 g/dL (ref 6.0–8.3)

## 2012-04-26 LAB — C-REACTIVE PROTEIN: CRP: 2.5 mg/dL — ABNORMAL HIGH (ref <0.60)

## 2012-04-26 MED ORDER — IOHEXOL 300 MG/ML  SOLN
100.0000 mL | Freq: Once | INTRAMUSCULAR | Status: AC | PRN
  Administered 2012-04-26: 100 mL via INTRAVENOUS

## 2012-04-26 NOTE — Assessment & Plan Note (Signed)
Continue rocephin. We are getting CT of shoulder and arm today

## 2012-04-26 NOTE — ED Notes (Signed)
Pt has picc line inserted 4/15 to tx septic arthritis.  Pt began to feel it began to dislodge on Sunday, but her home healthcare RN stated that it was still intact.  This evening she noticed it was completely out. No bleeding at insertion site.  No sob at this time.  Pt did give medications around 5pm through picc, but was out of heparin, so flushed with saline.  IV team paged.

## 2012-04-26 NOTE — ED Notes (Signed)
Paged Iv team,  She is looking up the specifics for this patient's picc line

## 2012-04-26 NOTE — Progress Notes (Signed)
  Subjective:    Patient ID: [name], female    DOB: 1966/10/22, 46 y.o.   MRN: [id number]  HPI  46 year old AA lady with Group B streptococcal septic shoulder sp I and D washout of her shoulder by Dr. Dion Saucier in April who has had persistent pain in her shoulder and now pain and thickening of the left arm. She is without fevers, chills or malaise. PICC line came out partially and has been re-applied. We are coordinating repeat CT of arm and shoulder today as well as CXR. I spent greater than 45 minutes with the patient including greater than 50% of time in face to face counsel of the patient and in coordination of their care.   Review of Systems  Constitutional: Negative for fever, chills, diaphoresis, activity change, appetite change, fatigue and unexpected weight change.  HENT: Negative for congestion, sore throat, rhinorrhea, sneezing, trouble swallowing and sinus pressure.   Eyes: Negative for photophobia and visual disturbance.  Respiratory: Negative for cough, chest tightness, shortness of breath, wheezing and stridor.   Cardiovascular: Negative for chest pain, palpitations and leg swelling.  Gastrointestinal: Negative for nausea, vomiting, abdominal pain, diarrhea, constipation, blood in stool, abdominal distention and anal bleeding.  Genitourinary: Negative for dysuria, hematuria, flank pain and difficulty urinating.  Musculoskeletal: Positive for joint swelling and arthralgias. Negative for myalgias, back pain and gait problem.  Skin: Negative for color change, pallor, rash and wound.  Neurological: Negative for dizziness, tremors, weakness and light-headedness.  Hematological: Negative for adenopathy. Does not bruise/bleed easily.  Psychiatric/Behavioral: Negative for behavioral problems, confusion, sleep disturbance, dysphoric mood, decreased concentration and agitation.       Objective:   Physical Exam  Constitutional: She is oriented to person, place, and time. She appears  well-developed and well-nourished. No distress.  HENT:  Head: Normocephalic and atraumatic.  Mouth/Throat: Oropharynx is clear and moist. No oropharyngeal exudate.  Eyes: Conjunctivae and EOM are normal. Pupils are equal, round, and reactive to light. No scleral icterus.  Neck: Normal range of motion. Neck supple. No JVD present.  Cardiovascular: Normal rate, regular rhythm and normal heart sounds.  Exam reveals no gallop and no friction rub.   No murmur heard. Pulmonary/Chest: Effort normal and breath sounds normal. No respiratory distress. She has no wheezes. She has no rales. She exhibits no tenderness.  Abdominal: She exhibits no distension and no mass. There is no tenderness. There is no rebound and no guarding.  Musculoskeletal: She exhibits no edema and no tenderness.       Arms: Lymphadenopathy:    She has no cervical adenopathy.  Neurological: She is alert and oriented to person, place, and time. She has normal reflexes. She exhibits normal muscle tone. Coordination normal.  Skin: Skin is warm and dry. She is not diaphoretic. No erythema. No pallor.  Psychiatric: She has a normal mood and affect. Her behavior is normal. Judgment and thought content normal.          Assessment & Plan:  Septic arthritis of shoulder, left Continue rocephin. We are getting CT of shoulder and arm today  Left arm cellulitis, question sarcoidosis inflammatory granulomatous reaction Arm is tender. Will get CT to look for deeper infection (pt will not fit in MRI)

## 2012-04-26 NOTE — Telephone Encounter (Signed)
Called report from [facility]. Septic arthritis vs bursitis. Gave written report to Tamika CMA to give to md as he is in with a pt. Faxed report handed to him

## 2012-04-26 NOTE — Patient Instructions (Signed)
WE NEED TO IMAGE YOUR SHOULDER  PLEASE MAKE APPT FOR BOOKS IN 6 WEEKS BUT WE MAY HAVE TO SEE YOU SOONER

## 2012-04-26 NOTE — Assessment & Plan Note (Signed)
Arm is tender. Will get CT to look for deeper infection (pt will not fit in MRI)

## 2012-04-27 ENCOUNTER — Ambulatory Visit (HOSPITAL_COMMUNITY)
Admission: RE | Admit: 2012-04-27 | Discharge: 2012-04-27 | Disposition: A | Discharge status: Home / Self Care | Payer: Managed Care, Other (non HMO) | Source: Ambulatory Visit | Attending: Orthopedic Surgery | Admitting: Orthopedic Surgery

## 2012-04-27 ENCOUNTER — Other Ambulatory Visit (HOSPITAL_COMMUNITY): Payer: Self-pay | Admitting: Orthopedic Surgery

## 2012-04-27 DIAGNOSIS — M009 Pyogenic arthritis, unspecified: Secondary | ICD-10-CM

## 2012-04-27 MED ORDER — LIDOCAINE HCL 1 % IJ SOLN
INTRAMUSCULAR | Status: AC
  Filled 2012-04-27: qty 20

## 2012-04-27 NOTE — ED Notes (Signed)
No bleeding noted from previous  PICC line site

## 2012-04-27 NOTE — ED Notes (Signed)
Received pt. From triage, pt. Alert and oriented, NAD noted 

## 2012-04-27 NOTE — Procedures (Signed)
Successful RUE DL 16XW power PICC line Tip svc/ra No comp Ready to use Full report in PACS

## 2012-04-27 NOTE — ED Notes (Signed)
Patient informed of the extended wait at this time for MD; Patient requesting to speak with RN; RN Theodoro Grist informed

## 2012-04-27 NOTE — Discharge Instructions (Signed)
Peripherally Inserted Central Catheter (PICC)  Home Guide  A peripherally inserted central catheter (PICC) is a long, thin, flexible tube that is inserted into a vein in the upper arm. It is a form of intravenous (IV) access. It is considered to be a "central" line because the tip of the PICC ends in a large vein in your chest. This large vein is called the superior vena cava (SVC). The PICC tip ends in the SVC because there is a lot of blood flow in the SVC. This allows medicines and IV fluids to be quickly distributed throughout the body. The PICC is inserted using a sterile technique by a specially trained nurse or physician. After the PICC is inserted, a chest X-ray is done to be sure it is in the correct place.   A PICC may be placed for different reasons, such as:   To give medicines and liquid nutrition that can only be given through a central line. Examples are:   Certain antibiotic treatments.   Chemotherapy.   Total parenteral nutrition (TPN).   To take frequent blood samples.   To give IV fluids and blood products.   If there is difficulty placing a peripheral intravenous (PIV) catheter.  If taken care of properly, a PICC can remain in place for several months. A PICC can also allow patients to go home early. Medicine and PICC care can be managed at home by a family member or home healthcare team.  RISKS AND COMPLICATIONS  Possible problems with a PICC can occasionally occur. This may include:   A clot (thrombus) forming in or at the tip of the PICC. This can cause the PICC to become clogged. A "clot-busting" medicine called tissue plasminogen activator (tPA) can be inserted into the PICC to help break up the clot.   Inflammation of the vein (phlebitis) in which the PICC is placed. Signs of inflammation may include redness, pain at the insertion site, red streaks, or being able to feel a "cord" in the vein where the PICC is located.   Infection in the PICC or at the insertion site. Signs of  infection may include fever, chills, redness, swelling, or pus drainage from the PICC insertion site.   PICC movement (malposition). The PICC tip may migrate from its original position due to excessive physical activity, forceful coughing, sneezing, or vomiting.   A break or cut in the PICC. It is important to not use scissors near the PICC.   Nerve or tendon irritation or injury during PICC insertion.  HOME CARE INSTRUCTIONS  Activity   You may bend your arm and move it freely. If your PICC is near or at the bend of your elbow, avoid activity with repeated motion at the elbow.   Avoid lifting heavy objects as instructed by your caregiver.   Avoid using a crutch with the arm on the same side as your PICC. You may need to use a walker.  PICC Dressing   Keep your PICC bandage (dressing) clean and dry to prevent infection.   Ask your caregiver when you may shower. Ask your caregiver to teach you how to wrap the PICC when you do take a shower.   Do not bathe, swim, or use hot tubs when you have a PICC.   Change the PICC dressing as instructed by your caregiver.   Change your PICC dressing if it becomes loose or wet.  General PICC Care   Check the PICC insertion site daily for leakage, redness, swelling,   or pain.   Flush the PICC as directed by your caregiver. Let your caregiver know right away if the PICC is difficult to flush or does not flush. Do not use force to flush the PICC.   Do not use a syringe that is less than 10 mLs to flush the PICC.   Never pull or tug on the PICC.   Avoid blood pressure checks on the arm with the PICC.   Keep your PICC identification card with you at all times.   Do not take the PICC out yourself. Only a trained clinical professional should remove the PICC.  SEEK IMMEDIATE MEDICAL CARE IF:   Your PICC is accidently pulled all the way out. If this happens, cover the insertion site with a bandage or gauze dressing. Do not throw the PICC away. Your caregiver will need to  inspect it.   Your PICC was tugged or pulled and has partially come out. Do not  push the PICC back in.   There is any type of drainage, redness, or swelling where the PICC enters the skin.   You cannot flush the PICC, it is difficult to flush, or the PICC leaks around the insertion site when it is flushed.   You hear a "flushing" sound when the PICC is flushed.   You have pain, discomfort, or numbness in your arm, shoulder, or jaw on the same side as the PICC .   You feel your heart "racing" or skipping beats.   You notice a hole or tear in the PICC.   You develop chills or a fever.  MAKE SURE YOU:    Understand these instructions.   Will watch your condition.   Will get help right away if you are not doing well or get worse.  Document Released: 06/05/2003 Document Revised: 11/18/2011 Document Reviewed: 04/05/2011  Exit[employer] Patient Information 2012 [employer].

## 2012-05-02 ENCOUNTER — Inpatient Hospital Stay: Payer: Managed Care, Other (non HMO) | Admitting: Infectious Disease

## 2012-05-05 ENCOUNTER — Ambulatory Visit (INDEPENDENT_AMBULATORY_CARE_PROVIDER_SITE_OTHER): Payer: Managed Care, Other (non HMO) | Admitting: Pulmonary Disease

## 2012-05-05 ENCOUNTER — Encounter: Payer: Self-pay | Admitting: Pulmonary Disease

## 2012-05-05 VITALS — BP 132/78 | HR 98 | Temp 98.4°F | Ht 71.0 in | Wt 302.2 lb

## 2012-05-05 DIAGNOSIS — D869 Sarcoidosis, unspecified: Secondary | ICD-10-CM

## 2012-05-05 NOTE — Patient Instructions (Signed)
No change in prednisone. I would hope you can come off this once started on other medications by rheumatology. Work on weight loss followup with me in 6mos.

## 2012-05-05 NOTE — Assessment & Plan Note (Signed)
The patient is doing well from a pulmonary standpoint with her sarcoidosis.  She really does not require treatment from my standpoint, however is on prednisone for her joint and constitutional symptoms.  She has seen rheumatology, who is planning more aggressive treatment for her joint symptoms once she is over her recent bowel of septic arthritis.  I've asked the patient to stay as active as possible, and to work on weight loss.  She will followup with me again in 6 months.

## 2012-05-05 NOTE — Progress Notes (Signed)
  Subjective:    Patient ID: [name], female    DOB: 1966/06/21, 46 y.o.   MRN: [id number]  HPI The patient comes in today for followup of her known sarcoidosis.  She has had very little pulmonary involvement or symptoms, but has had ongoing constitutional symptoms and joint complaints.  She has been maintained on low-dose prednisone in order to keep the symptoms control.  She is recently seen rheumatology, who plans to start her on either methotrexate or Plaquenil once she is over her antibiotic therapy for septic arthritis.  The patient states that her breathing is completely stable, although she does have a very mild cough that she thinks may be due to a cold or postnasal drip.   Review of Systems  Constitutional: Negative for fever and unexpected weight change.  HENT: Negative for ear pain, nosebleeds, congestion, sore throat, rhinorrhea, sneezing, trouble swallowing, dental problem, postnasal drip and sinus pressure.   Eyes: Negative for redness and itching.  Respiratory: Positive for cough. Negative for chest tightness, shortness of breath and wheezing.   Cardiovascular: Negative for palpitations and leg swelling.  Gastrointestinal: Negative for nausea and vomiting.  Genitourinary: Negative for dysuria.  Musculoskeletal: Negative for joint swelling.  Skin: Negative for rash.  Neurological: Negative for headaches.  Hematological: Does not bruise/bleed easily.  Psychiatric/Behavioral: Negative for dysphoric mood. The patient is not nervous/anxious.        Objective:   Physical Exam Obese female in no acute distress Nose without purulence or discharge noted Chest totally clear to auscultation with no wheezes Cardiac exam with regular rate and rhythm Lower extremities with no significant edema, no cyanosis Alert and oriented, moves all 4 extremities.       Assessment & Plan:

## 2012-05-09 ENCOUNTER — Telehealth: Payer: Self-pay | Admitting: *Deleted

## 2012-05-09 NOTE — Telephone Encounter (Signed)
Pt needs return appt to see Dr. Daiva Eves.  Currently staying in [place].  Offered appt for 05/10/12 @ 0945.  Pt will call back if unable to keep appt due to transportation issues.  She is not currently driving.

## 2012-05-10 ENCOUNTER — Ambulatory Visit: Payer: Managed Care, Other (non HMO) | Admitting: Infectious Disease

## 2012-05-12 ENCOUNTER — Encounter (HOSPITAL_COMMUNITY): Payer: Self-pay | Admitting: Emergency Medicine

## 2012-05-12 ENCOUNTER — Telehealth: Payer: Self-pay | Admitting: *Deleted

## 2012-05-12 ENCOUNTER — Emergency Department (HOSPITAL_COMMUNITY): Payer: Managed Care, Other (non HMO)

## 2012-05-12 ENCOUNTER — Emergency Department (HOSPITAL_COMMUNITY)
Admission: EM | Admit: 2012-05-12 | Discharge: 2012-05-12 | Disposition: A | Discharge status: Home / Self Care | Payer: Managed Care, Other (non HMO) | Attending: Emergency Medicine | Admitting: Emergency Medicine

## 2012-05-12 DIAGNOSIS — I10 Essential (primary) hypertension: Secondary | ICD-10-CM | POA: Insufficient documentation

## 2012-05-12 DIAGNOSIS — S43036A Inferior dislocation of unspecified humerus, initial encounter: Secondary | ICD-10-CM | POA: Insufficient documentation

## 2012-05-12 DIAGNOSIS — M25512 Pain in left shoulder: Secondary | ICD-10-CM

## 2012-05-12 DIAGNOSIS — Z9889 Other specified postprocedural states: Secondary | ICD-10-CM | POA: Insufficient documentation

## 2012-05-12 DIAGNOSIS — M25519 Pain in unspecified shoulder: Secondary | ICD-10-CM | POA: Insufficient documentation

## 2012-05-12 DIAGNOSIS — X58XXXA Exposure to other specified factors, initial encounter: Secondary | ICD-10-CM | POA: Insufficient documentation

## 2012-05-12 DIAGNOSIS — S43005A Unspecified dislocation of left shoulder joint, initial encounter: Secondary | ICD-10-CM

## 2012-05-12 LAB — DIFFERENTIAL
Basophils Absolute: 0.1 K/uL (ref 0.0–0.1)
Basophils Relative: 2 % — ABNORMAL HIGH (ref 0–1)
Eosinophils Absolute: 0.3 K/uL (ref 0.0–0.7)
Eosinophils Relative: 3 % (ref 0–5)
Lymphocytes Relative: 27 % (ref 12–46)
Lymphs Abs: 2.1 K/uL (ref 0.7–4.0)
Monocytes Absolute: 0.6 K/uL (ref 0.1–1.0)
Monocytes Relative: 8 % (ref 3–12)
Neutro Abs: 4.8 K/uL (ref 1.7–7.7)
Neutrophils Relative %: 61 % (ref 43–77)

## 2012-05-12 LAB — BASIC METABOLIC PANEL
CO2: 28 mEq/L (ref 19–32)
Calcium: 10.1 mg/dL (ref 8.4–10.5)
Chloride: 100 mEq/L (ref 96–112)
Creatinine, Ser: 0.86 mg/dL (ref 0.50–1.10)
Glucose, Bld: 125 mg/dL — ABNORMAL HIGH (ref 70–99)
Sodium: 140 mEq/L (ref 135–145)

## 2012-05-12 LAB — CBC
HCT: 34.5 % — ABNORMAL LOW (ref 36.0–46.0)
Hemoglobin: 11.5 g/dL — ABNORMAL LOW (ref 12.0–15.0)
MCH: 28.5 pg (ref 26.0–34.0)
MCHC: 33.3 g/dL (ref 30.0–36.0)
MCV: 85.4 fL (ref 78.0–100.0)
Platelets: 276 K/uL (ref 150–400)
RBC: 4.04 MIL/uL (ref 3.87–5.11)
RDW: 14.2 % (ref 11.5–15.5)
WBC: 7.9 K/uL (ref 4.0–10.5)

## 2012-05-12 MED ORDER — MORPHINE SULFATE 4 MG/ML IJ SOLN
4.0000 mg | Freq: Once | INTRAMUSCULAR | Status: AC
  Administered 2012-05-12: 4 mg via INTRAVENOUS
  Filled 2012-05-12: qty 1

## 2012-05-12 MED ORDER — OXYCODONE-ACETAMINOPHEN 5-325 MG PO TABS: 2.0000 | ORAL_TABLET | ORAL | Status: AC | PRN

## 2012-05-12 NOTE — ED Notes (Signed)
Ortho MD at bedside to evaluate pt

## 2012-05-12 NOTE — ED Notes (Addendum)
Blood drawn from PICC Line, April RN supervisor. [place] has infiltrate in line, red line used to draw and flushed.

## 2012-05-12 NOTE — ED Notes (Signed)
Pt eating prior to discharge. Pt feels better after morphine.

## 2012-05-12 NOTE — ED Provider Notes (Signed)
History     CSN: [id number]  Arrival date & time 05/12/12  1947   First MD Initiated Contact with Patient 05/12/12 2118      Chief Complaint  Patient presents with  . Shoulder Pain    (Consider location/radiation/quality/duration/timing/severity/associated sxs/prior treatment) HPI Comments: Hx of recent septic left shoulder.  Finished IV antibiotics.  Now has worsened pain and decreased ROM in L shoulder over last few days.  Patient is a 46 y.o. female presenting with shoulder pain. The history is provided by the patient.  Shoulder Pain This is a new problem. Episode onset: last few days. The problem occurs constantly. The problem has been gradually worsening. Pertinent negatives include no abdominal pain, chest pain, congestion, fever or rash. The symptoms are aggravated by bending. She has tried nothing for the symptoms.    Past Medical History  Diagnosis Date  . Hypertension   . Ovarian cyst   . Fibroid tumor   . Sarcoidosis   . Complication of anesthesia     "felt like it was difficult to come out; very nauseated"  . PONV (postoperative nausea and vomiting)   . Angina   . PVC (premature ventricular contraction)   . Bifascicular block   . Arrhythmia 03/22/12    "I have an irregular heartbeat all the time"  . Left arm cellulitis, question sarcoidosis inflammatory granulomatous reaction 03/23/2012  . Septic arthritis of shoulder, left 03/24/2012    Past Surgical History  Procedure Date  . Ovarian cyst removal 1986    "included right tube removed"  . Tubal ligation 1991  . Bladder surgery 1966    bladder/kidney surgery to reconnect tubes.  . Uterine fibroid surgery     "three since 1986"  . Tonsillectomy and adenoidectomy 1984  . Right oophorectomy 1986  . Appendectomy 1988  . Shoulder arthroscopy 03/24/2012    Procedure: ARTHROSCOPY SHOULDER;  Surgeon: Eulas Post, MD;  Location: [facility] OR;  Service: Orthopedics;  Laterality: Left;  Left shoulder     Family  History  Problem Relation Age of Onset  . Anemia Sister   . Anemia Sister   . Diabetes Mother   . Heart disease Mother   . Hypertension Mother   . Diabetes Father   . Hypertension Father   . Scleroderma Father     History  Substance Use Topics  . Smoking status: Never Smoker   . Smokeless tobacco: Never Used  . Alcohol Use: No    OB History    Grav Para Term Preterm Abortions TAB SAB Ect Mult Living                  Review of Systems  Constitutional: Negative for fever and activity change.  HENT: Negative for congestion.   Eyes: Negative for visual disturbance.  Respiratory: Negative for chest tightness and shortness of breath.   Cardiovascular: Negative for chest pain and leg swelling.  Gastrointestinal: Negative for abdominal pain.  Genitourinary: Negative for dysuria.  Skin: Negative for rash.  Neurological: Negative for syncope.  Psychiatric/Behavioral: Negative for behavioral problems.    Allergies  Review of patient's allergies indicates no known allergies.  Home Medications   Current Outpatient Rx  Name Route Sig Dispense Refill  . HYDROCHLOROTHIAZIDE 25 MG PO TABS Oral Take 1 tablet by mouth daily.    Marland Kitchen HYDROCODONE-ACETAMINOPHEN 7.5-300 MG PO TABS Oral Take 1 tablet by mouth every 8 (eight) hours as needed. 30 each 0  . METOPROLOL TARTRATE 25 MG PO TABS Oral  Take 12.5 mg by mouth 2 (two) times daily.      Marland Kitchen PREDNISONE 10 MG PO TABS  10 mg daily. Take as directed    . OXYCODONE-ACETAMINOPHEN 5-325 MG PO TABS Oral Take 2 tablets by mouth every 4 (four) hours as needed for pain. 20 tablet 0    BP 139/96  Pulse 89  Temp(Src) 98.2 F (36.8 C) (Oral)  Resp 18  SpO2 100%  Physical Exam  Constitutional: She is oriented to person, place, and time. She appears well-developed and well-nourished.  HENT:  Head: Normocephalic and atraumatic.  Eyes: Conjunctivae and EOM are normal. Pupils are equal, round, and reactive to light. No scleral icterus.  Neck:  Normal range of motion. Neck supple.  Cardiovascular: Normal rate and regular rhythm.  Exam reveals no gallop and no friction rub.   No murmur heard. Pulmonary/Chest: Effort normal and breath sounds normal. No respiratory distress. She has no wheezes. She has no rales. She exhibits no tenderness.  Abdominal: Soft. She exhibits no distension and no mass. There is no tenderness. There is no rebound and no guarding.  Musculoskeletal: Normal range of motion.       L shoulder mild ttp.  Pain with abduction.    Neurological: She is alert and oriented to person, place, and time. She has normal reflexes. No cranial nerve deficit.  Skin: Skin is warm and dry. No rash noted.  Psychiatric: She has a normal mood and affect. Her behavior is normal. Judgment and thought content normal.    ED Course  Procedures (including critical care time)  Labs Reviewed  CBC - Abnormal; Notable for the following:    Hemoglobin 11.5 (*)    HCT 34.5 (*)    All other components within normal limits  DIFFERENTIAL - Abnormal; Notable for the following:    Basophils Relative 2 (*)    All other components within normal limits  BASIC METABOLIC PANEL - Abnormal; Notable for the following:    Glucose, Bld 125 (*)    GFR calc non Af Amer 79 (*)    All other components within normal limits  SEDIMENTATION RATE  C-REACTIVE PROTEIN   Dg Chest 2 View  05/12/2012  *RADIOLOGY REPORT*  Clinical Data: Shoulder pain.  Recent shoulder surgery.  CHEST - 2 VIEW  Comparison: 04/26/2012  Findings: Normal heart size and pulmonary vascularity.  No focal airspace consolidation in the lungs.  No blunting of costophrenic angles.  No pneumothorax.  Nodular prominence of the right hilum may represent adenopathy.  This appears stable since the previous study. The adenopathy was noted on prior chest CT dated 06/08/2011. Right PICC catheter with tip in the mid SVC region.  No pneumothorax.  IMPRESSION: No evidence of active pulmonary disease. Right  hilar prominence suggesting lymphadenopathy.  No significant change since previous study.  Original Report Authenticated By: Marlon Pel, M.D.   Dg Shoulder Left  05/12/2012  *RADIOLOGY REPORT*  Clinical Data: Left shoulder pain. Prior history of shoulder surgery for septic arthritis.  LEFT SHOULDER - 2+ VIEW  Comparison: 04/26/2012  Findings: The left shoulder is dislocated inferiorly.  This is new since prior CT.  No fracture.  Degenerative changes in the left AC joint.  IMPRESSION: Inferior left shoulder dislocation.  Original Report Authenticated By: Cyndie Chime, M.D.     1. Left shoulder pain   2. Shoulder dislocation, left, initial encounter       MDM  Hx of recent septic left shoulder.  Finished IV  antibiotics.  Now has worsened pain and decreased ROM in L shoulder over last few days.  Can range shoulder below the neck without any pain.  No redness or swelling.  Only pain with abduction above the head.  Xray with inferior dislocation.  D/w ortho - has seen in ED.  Recommended sling and f/u with pt's orthopedist.  Treated pain.  Pt comfortable with plan and will follow up.         Army Chaco, MD 05/12/12 [phone]

## 2012-05-12 NOTE — ED Notes (Signed)
Pt states that she has had surgery on her left shoulder 5-6 weeks ago. Pt was placed on IV antibiotics and has a PICC line for antibiotics. Pt states that the antibiotics were stopped 2-3 days ago and since then she has been having pain in her left shoulder and she has been having inflammation and redness to her left arm. Pt able to move left arm with pain. Pulses present, no redness noted in left arm.

## 2012-05-12 NOTE — ED Notes (Signed)
PT. REPORTS PAIN AT LEFT UPPER ARM ONSET THIS WEEK , STATES SURGERY 6 WEEKS AGO AT LEFT SHOULDER FOR SEPTIC ARTHRITIS.

## 2012-05-12 NOTE — Consult Note (Signed)
Seen in room Blue 3 at request of Dr. Maisie Fus.  She does not have the clinical picture of recurrent infection.  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  She should wear a sling and see Dr. Dion Saucier early next week.

## 2012-05-12 NOTE — ED Provider Notes (Signed)
I have seen and examined this patient with the resident.  I agree with the resident's note, assessment and plan except as indicated.    Patient with decreased range of motion in her shoulder.  With an x-ray that demonstrates inferior dislocation.  Orthopedics was contacted and they have seen and evaluated the patient and will follow her up in the office next week.  Nat Christen, MD 05/12/12 [phone]

## 2012-05-12 NOTE — Discharge Instructions (Signed)
Adhesive Capsulitis Sometimes the shoulder becomes stiff and is painful to move. Some people say it feels as if the shoulder is frozen in place. Because of this, the condition is called "frozen shoulder." Its medical name is adhesive capsulitis.  The shoulder joint is made up of strong connective tissue that attaches the ball of the humerus to the shallow shoulder socket. This strong connective tissue is called the joint capsule. This tissue can become stiff and swollen. That is when adhesive capsulitis sets in. CAUSES  It is not always clear just what the cause adhesive capsulitis. Possibilities include:  Injury to the shoulder joint.   Strain. This is a repetitive injury brought about by overuse.   Lack of use. Perhaps your arm or hand was otherwise injured. It might have been in a sling for awhile. Or perhaps you were not using it to avoid pain.   Referred pain. This is a sort of trick the body plays. You feel pain in the shoulder. But, the pain actually comes from an injury somewhere else in the body.   Long-standing health problems. Several diseases can cause adhesive capsulitis. They include diabetes, heart disease, stroke, thyroid problems, rheumatoid arthritis and lung disease.   Being a women older than 40. Anyone can develop adhesive capsulitis but it is most common in women in this age group.  SYMPTOMS   Pain.   It occurs when the arm is moved.   Parts of the shoulder might hurt if they are touched.   Pain is worse at night or when resting.   Soreness. It might not be strong enough to be called pain. But, the shoulder aches.   The shoulder does not move freely.   Muscle spasms.   Trouble sleeping because of shoulder ache or pain.  DIAGNOSIS  To decide if you have adhesive capsulitis, your healthcare provider will probably:  Ask about symptoms you have noticed.   Ask about your history of joint pain and anything that might have caused the pain.   Ask about your  overall health.   Use hands to feel your shoulder and neck.   Ask you to move your shoulder in specific directions. This may indicate the origin of the pain.   Order imaging tests; pictures of the shoulder. They help pinpoint the source of the problem. An X-ray might be used. For more detail, an MRI is often used. An MRI details the tendons, muscles and ligaments as well as the joint.  TREATMENT  Adhesive capsulitis can be treated several ways. Most treatments can be done in a clinic or in your healthcare provider's office. Be sure to discuss the different options with your caregiver. They include:  Physical therapy. You will work on specific exercises to get your shoulder moving again. The exercises usually involve stretching. A physical therapist (a caregiver with special training) can show you what to do and what not to do. The exercises will need to be done daily.   Medication.   Over-the-counter medicines may relieve pain and inflammation (the body's way of reacting to injury or infection).   Corticosteroids. These are stronger drugs to reduce pain and inflammation. They are given by injection (shots) into the shoulder joint. Frequent treatment is not recommended.   Muscle relaxants. Medication may be prescribed to ease muscle spasms.   Treatment of underlying conditions. This means treating another condition that is causing your shoulder problem. This might be a rotator cuff (tendon) problem   Shoulder manipulation. The shoulder will   be moved by your healthcare provider. You would be under general anesthesia (given a drug that puts you to sleep). You would not feel anything. Sometimes the joint will be injected with salt water (saline) at high pressure to break down internal scarring in the joint capsule.   Surgery. This is rarely needed. It may be suggested in advanced cases after all other treatment has failed.  PROGNOSIS  In time, most people recover from adhesive capsulitis.  Sometimes, however, the pain goes away but full movement of the shoulder does not return.  HOME CARE INSTRUCTIONS   Take any pain medications recommended by your healthcare provider. Follow the directions carefully.   If you have physical therapy, follow through with the therapist's suggestions. Be sure you understand the exercises you will be doing. You should understand:   How often the exercises should be done.   How many times each exercise should be repeated.   How long they should be done.   What other activities you should do, or not do.   That you should warm up before doing any exercise. Just 5 to 10 minutes will help. Small, gentle movements should get your shoulder ready for more.   Avoid high-demand exercise that involves your shoulder such as throwing. This type of exercise can make pain worse.   Consider using cold packs. Cold may ease swelling and pain. Ask your healthcare provider if a cold pack might help you. If so, get directions on how and when to use them.  SEEK MEDICAL CARE IF:   You have any questions about your medications.   Your pain continues to increase.  Document Released: 09/26/2009 Document Revised: 11/18/2011 Document Reviewed: 09/26/2009 [facility] Patient Information 2012 [place], [place].Sling Use After Injury or Surgery You have been put in a sling today because of an injury or following surgery. If you have a tendon or bone injury it may take up to 6 weeks to heal. Use the sling as directed until your caregiver says it is no longer needed. The sling protects and keeps you from using the injured part. Hanging your arm in a sling will give rest and support to the injured part. This also helps with comfort and healing. Slings are used for injuries made worse or more painful by movement. Examples include:  Broken arms.   Broken collarbones.   Shoulder injuries.   Following surgery.  The sling should fit comfortably, with your elbow at one end of the  sling and your hand at the other end. Your elbow is bent 90 degrees lying across your waist and rests in the sling with your thumb pointing up. Your hand is outside the open end of the sling. Your hand should be slightly higher than your elbow. You may also pad the sling behind your neck with some cloth or foam rubber.  A swathe may also be used if it is necessary to keep you from lifting your injured arm. A swathe is a wrap or ace bandage that goes around your chest over your injured arm.  To take the weight off your neck, some slings have a strap that goes around your neck and down your back. One strap is connected to the closed elbow side of the sling with the other end of the strap attached to the wrist side. With a sling like this, your injured shoulder, arm, wrist, or hand is in the sling, the weight is more on your shoulder and back. This is different from the illustration where  the sling is supported only by the neck.  In an emergency, a sling can be as simple as a belt or towel tied around your neck to hold your forearm.  HOME CARE INSTRUCTIONS   Do not use your shoulder until instructed to by your caregiver.   If you have been prescribed physical therapy, keep appointments as directed.   For the first couple days following your injury and during times when you are sore, you may use ice on the injured area for 15 to 20 minutes 3 to 4 times per day while awake. Put the ice in a plastic bag and place a towel between the bag of ice and your skin. This will help keep the swelling down.   If there is numbness in the fifth finger and ring fingers you may need to pad the elbow to relieve pressure on the ulnar nerve (the crazy bone).   Keep your arm on your chest when lying down.   If a plaster splint was applied, wear the splint until you are seen for a follow-up examination. Rest it on nothing harder than a pillow the first 24 hours. Do not get it wet. You may take it off to take a shower or bath  unless instructed otherwise by your caregiver.   You may have been given an elastic bandage to use with the plaster splint or alone. The splint is too tight if you have numbness, tingling, or if your hand becomes cold and blue. Adjust or reapply the bandage to make it comfortable.   Only take over-the-counter or prescription medicines for pain, discomfort, or fever as directed by your caregiver.   If range of motion exercises are permitted by your caregiver, do not go over the limits suggested. If you have increased pain from doing gentle exercises, stop the exercises until you see your caregiver again.   The length of time needed for healing depends on what your injury or surgery was.  SEEK IMMEDIATE MEDICAL CARE IF:   You have an increase in bruising, swelling or pain in the area of your injury or surgery.   You notice a blue color of or coldness in your fingers.   Pain relief is not obtained with medications or any of your problems are getting worse.  Document Released: 07/13/2004 Document Revised: 11/18/2011 Document Reviewed: 10/14/2007 [facility] Patient Information 2012 [place], [place].

## 2012-05-12 NOTE — Telephone Encounter (Signed)
Dr. Luciana Axe advised her to go to nearest hospital. I called her & gave her this information. Told her she must be seen before her appt here on 6/10.  She will go to [facility] hospital. Urged her to go now as opposed to evening. She will go.

## 2012-05-12 NOTE — Telephone Encounter (Signed)
She had missed the appt she had with her md here. C/o increased pain at operative site & some swelling. Pain is 4/10 & she has started taking her pain meds again. The antibiotics finished last Sunday. Wants to know what to do about this. She is in [place]. # is [phone]. Her supplies company is [employer] [phone]. Her home health RN is from [facility] [phone]. She wants to know if she is to get more antibiotics or if we need to call in saline & heparin to keep the PICC patent until she is seen. States the ortho surgeon told her to see [place]. She has had transportation problems getting to [place].  I will speak with Dr. Luciana Axe as her md is not available

## 2012-05-13 LAB — C-REACTIVE PROTEIN: CRP: 2.73 mg/dL — ABNORMAL HIGH (ref <0.60)

## 2012-05-13 LAB — SEDIMENTATION RATE: Sed Rate: 27 mm/hr — ABNORMAL HIGH (ref 0–22)

## 2012-05-17 ENCOUNTER — Other Ambulatory Visit: Payer: Self-pay | Admitting: Orthopedic Surgery

## 2012-05-22 ENCOUNTER — Inpatient Hospital Stay: Payer: Managed Care, Other (non HMO) | Admitting: Internal Medicine

## 2012-05-29 ENCOUNTER — Telehealth: Payer: Self-pay | Admitting: *Deleted

## 2012-05-29 ENCOUNTER — Ambulatory Visit (INDEPENDENT_AMBULATORY_CARE_PROVIDER_SITE_OTHER): Payer: Managed Care, Other (non HMO) | Admitting: Internal Medicine

## 2012-05-29 ENCOUNTER — Encounter: Payer: Self-pay | Admitting: Internal Medicine

## 2012-05-29 VITALS — BP 163/111 | HR 90 | Temp 98.7°F | Ht 71.0 in | Wt 304.0 lb

## 2012-05-29 DIAGNOSIS — M009 Pyogenic arthritis, unspecified: Secondary | ICD-10-CM

## 2012-05-29 NOTE — Telephone Encounter (Signed)
Appointment reminder

## 2012-05-29 NOTE — Progress Notes (Signed)
Patient ID: [name], female   DOB: Nov 28, 1966, 46 y.o.   MRN: [id number]    [facility]  Patient Active Problem List  Diagnosis  . Hypertension  . PVC (premature ventricular contraction)  . Bifascicular block  . Sarcoidosis  . Chest pain  . Left arm cellulitis, question sarcoidosis inflammatory granulomatous reaction  . Septic arthritis of shoulder, left    Patient's Medications  New Prescriptions   No medications on file  Previous Medications   ACETAMINOPHEN (TYLENOL) 500 MG TABLET    Take 500 mg by mouth every 6 (six) hours as needed.   HYDROCHLOROTHIAZIDE 25 MG TABLET    Take 1 tablet by mouth daily.   METOPROLOL TARTRATE (LOPRESSOR) 25 MG TABLET    Take 12.5 mg by mouth 2 (two) times daily.     PREDNISONE (DELTASONE) 10 MG TABLET    10 mg daily. Take as directed  Modified Medications   No medications on file  Discontinued Medications   HYDROCODONE-ACETAMINOPHEN (VICODIN ES) 7.5-300 MG TABS    Take 1 tablet by mouth every 8 (eight) hours as needed.    Subjective: [name] is in for her followup visit. She believes that she completed ceftriaxone about 2 weeks ago for her group B strep left shoulder infection that was diagnosed in early April. She saw Dr. Daiva Eves last month and a followup CT scan was ordered for May 15. It showed some left shoulder effusion. She was seen in the emergency department by orthopedics and was thought to have inferior subluxation rather than persistent infection. She states that she was seen back in the office by Dr. Dion Saucier who gave her the same impression. Her shoulder is feeling better. She is now only needing Tylenol 2 or 3 times a week for her pain. She has not had any fever, chills or sweats.  Objective: Temp: 98.7 F (37.1 C) (06/17 1430) Temp src: Oral (06/17 1430) BP: 163/111 mmHg (06/17 1430) Pulse Rate: 90  (06/17 1430)  General: She is in no distress and is in good spirits Skin: Her right arm PICC site  appear somewhat dirty but without any evidence of inflammation Left shoulder: She has no swelling, warmth, redness or pain  Lab Results Lab Results  Component Value Date   CRP 2.73* 05/12/2012    Lab Results  Component Value Date   ESRSEDRATE 27* 05/12/2012      Assessment: She appears to be doing much better. Her inflammatory markers have improved significantly 2 weeks ago. I will repeat them today, observe off of antibiotics and have her PICC removed. I asked her to call me if she has any evidence of relapsed infection.  Plan: 1. Continue observation off of antibiotics 2. Removed PICC 3. Repeat sedimentation rate and C-reactive protein   Cliffton Asters, MD [facility] [phone] pager   [phone] cell 05/29/2012, 2:50 PM

## 2012-05-29 NOTE — Progress Notes (Signed)
RN received verbal order to discontinue the patient's PICC line.  Patient identified with name and date of birth. PICC dressing removed, site unremarkable.  Sutures removed without difficulty.  PICC line removed using sterile procedure @ 1500. PICC length equal to that noted in patient's hospital chart of 41 cm. Sterile petroleum gauze + sterile 4X4 applied to PICC site, pressure applied for 10 minutes and covered with Medipore tape as a pressure dressing. Patient tolerated procedure without complaints.  Patient instructed to limit use of arm for 1 hour. Patient instructed that the pressure dressing should remain in place for 24 hours. Patient verbalized understanding of these instructions.

## 2012-05-30 LAB — SEDIMENTATION RATE: Sed Rate: 17 mm/hr (ref 0–22)

## 2012-06-01 ENCOUNTER — Telehealth: Payer: Self-pay | Admitting: *Deleted

## 2012-06-01 NOTE — Telephone Encounter (Signed)
She left a message on the phone last night & when I called back, unable to leave a message. I tried again this am but again unable to leave a message. Will give her labs when she calls back

## 2012-06-01 NOTE — Telephone Encounter (Signed)
Pt given lab results that have improved since done 04/26/12.  RN advised to call Dr. Shelba Flake office for Occupational Therapy referral.  Pt verbalized understanding of results and calling Dr. Dion Saucier.

## 2012-08-31 IMAGING — CR DG CHEST 2V
2 series · 2 of 2 positions shown · non-contrast
Comparison: None

CLINICAL DATA: Chest pain.

CHEST - 2 VIEW

[w chest pa]
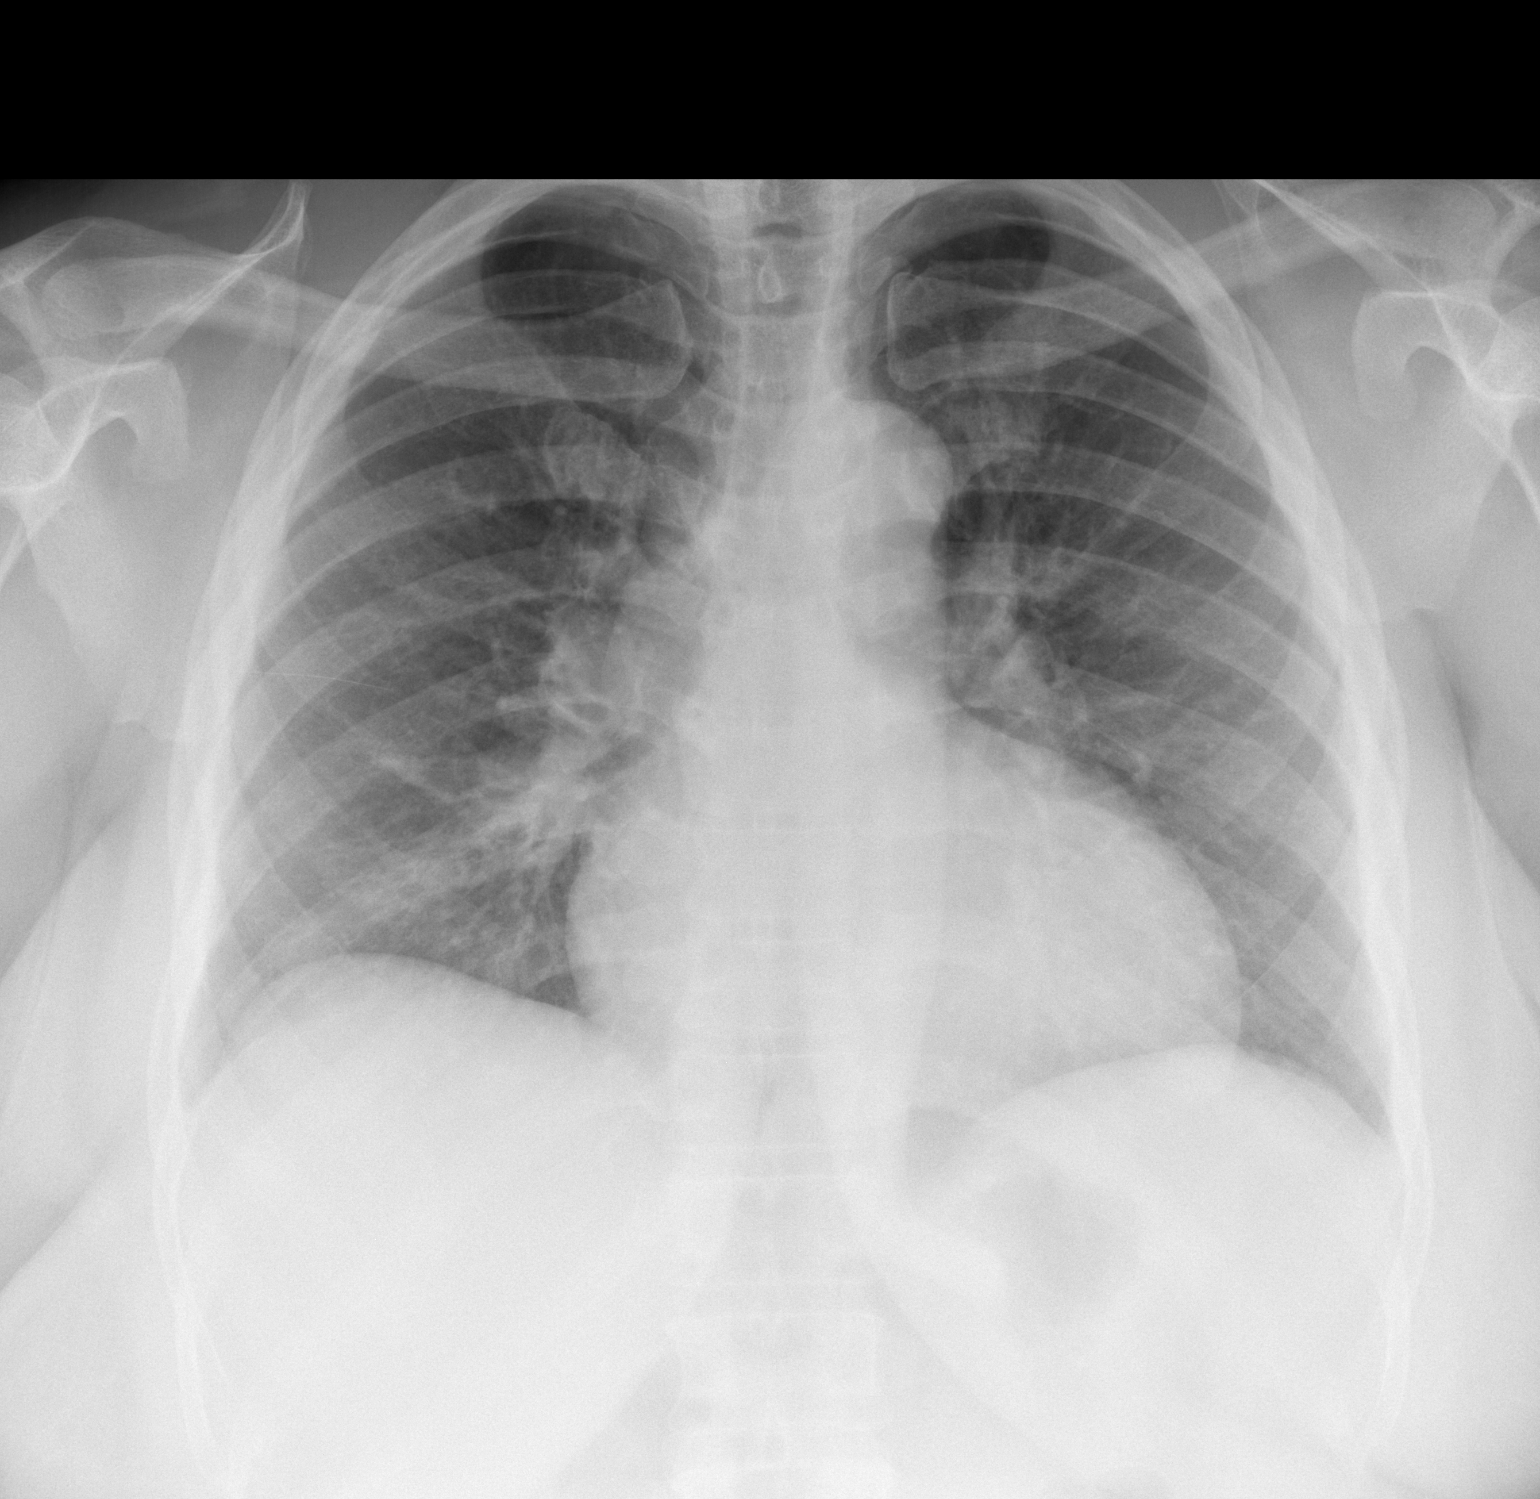

[w chest lat]
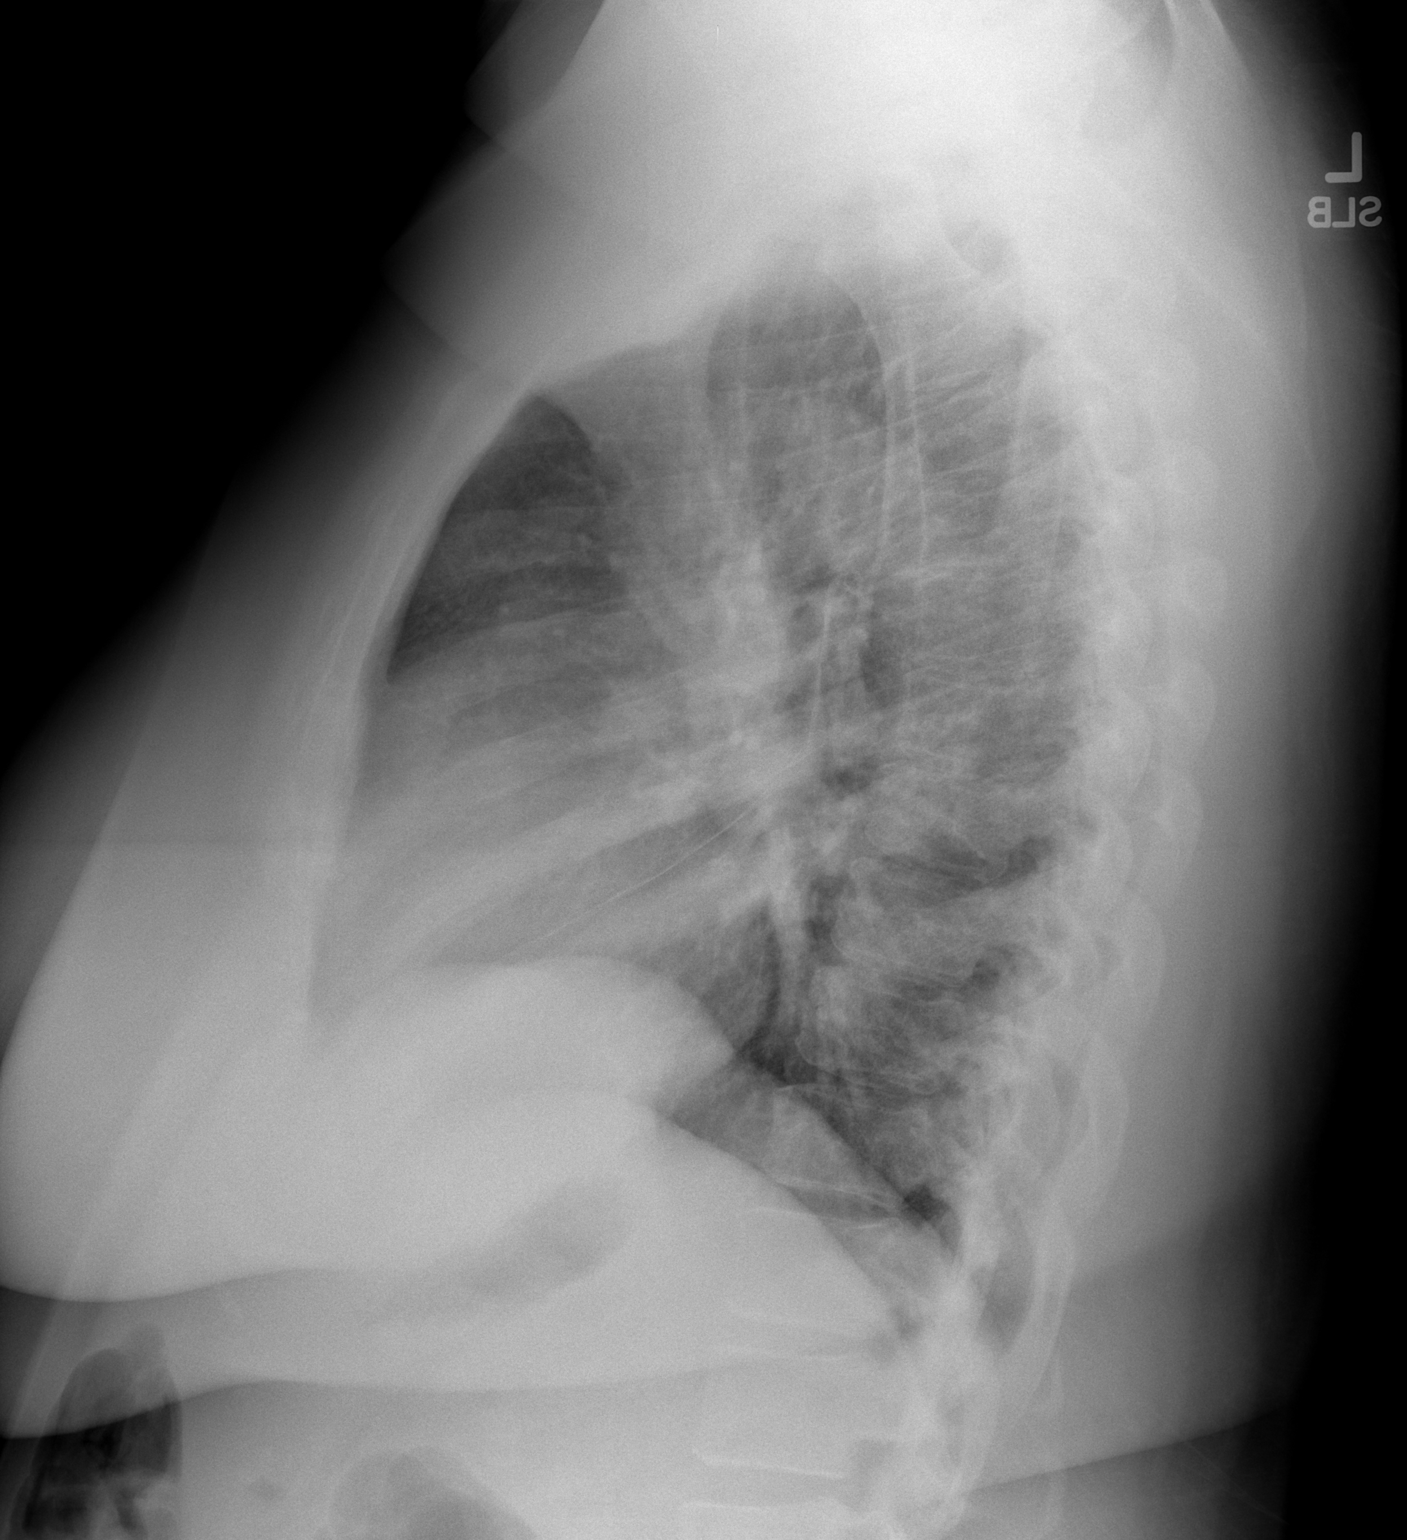

[2 of 2 positions shown; findings below may reference images not displayed]

FINDINGS: Mild cardiomegaly.  Mild peribronchial thickening.  Lungs
are clear.  No effusions or acute bony abnormality.
IMPRESSION: Mild cardiomegaly and bronchitic changes.

## 2012-11-06 ENCOUNTER — Ambulatory Visit: Payer: Managed Care, Other (non HMO) | Admitting: Pulmonary Disease

## 2012-12-01 ENCOUNTER — Telehealth: Payer: Self-pay | Admitting: Pulmonary Disease

## 2012-12-01 NOTE — Telephone Encounter (Signed)
ATC pt NA mailbox is full [facility]

## 2012-12-04 NOTE — Telephone Encounter (Signed)
Please advise if you know of a doctor in [place]. Is it ok to place a referral? Carron Curie, CMA

## 2012-12-04 NOTE — Telephone Encounter (Signed)
I do not know a pulmonary doctor in [place], but I do know a few at [facility] if she would go there as an outpt. Lorane Gell, MD is excellent

## 2012-12-04 NOTE — Telephone Encounter (Signed)
ATC the pt, NA and mailbox full so unable to leave a msg, [facility]

## 2012-12-05 NOTE — Telephone Encounter (Signed)
lmomtcb x1 on mobile # listed as still NA at home # listed

## 2012-12-07 NOTE — Telephone Encounter (Signed)
ATC-unable to leave message as voicemail is full.

## 2012-12-08 NOTE — Telephone Encounter (Signed)
Spoke with patient-states she is willing to see Dr Katrine Coho at [facility] referral from [facility]; will forward to [facility] to make sure he is okay with [place] giving referral. Pt aware [place] out of office until 12-14-12.

## 2012-12-14 ENCOUNTER — Other Ambulatory Visit: Payer: Self-pay | Admitting: Pulmonary Disease

## 2012-12-14 ENCOUNTER — Encounter: Payer: Self-pay | Admitting: Pulmonary Disease

## 2012-12-14 DIAGNOSIS — D869 Sarcoidosis, unspecified: Secondary | ICD-10-CM

## 2012-12-18 NOTE — Telephone Encounter (Signed)
Pt has been notified that referral has been made to [facility].

## 2013-06-17 IMAGING — RF DG FLUORO GUIDE NDL PLC/BX
3 series · 3 of 3 positions shown · non-contrast
Comparison: CT scan dated 03/22/2012

CLINICAL DATA: Shoulder joint effusion.  Possible septic joint.
Abnormal fluid and edema along the pectoralis and deltoid
musculature anteriorly.

FLUORO GUIDED LEFT SHOULDER ARTHROCENTESIS

[Series 1: run · 1 of 1 slices shown (1 of 3)]
[im 1/1]
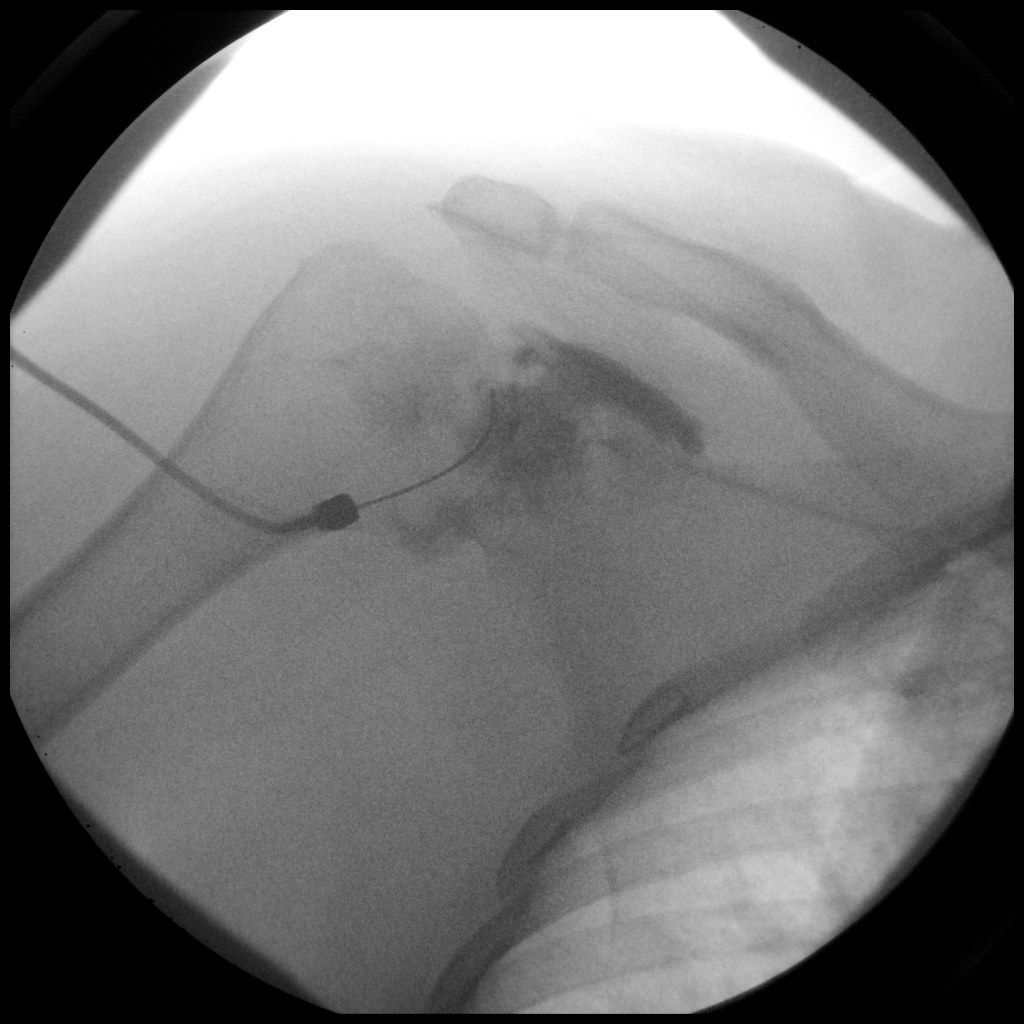

[Series 2: run · 1 of 1 slices shown (2 of 3)]
[im 1/1]
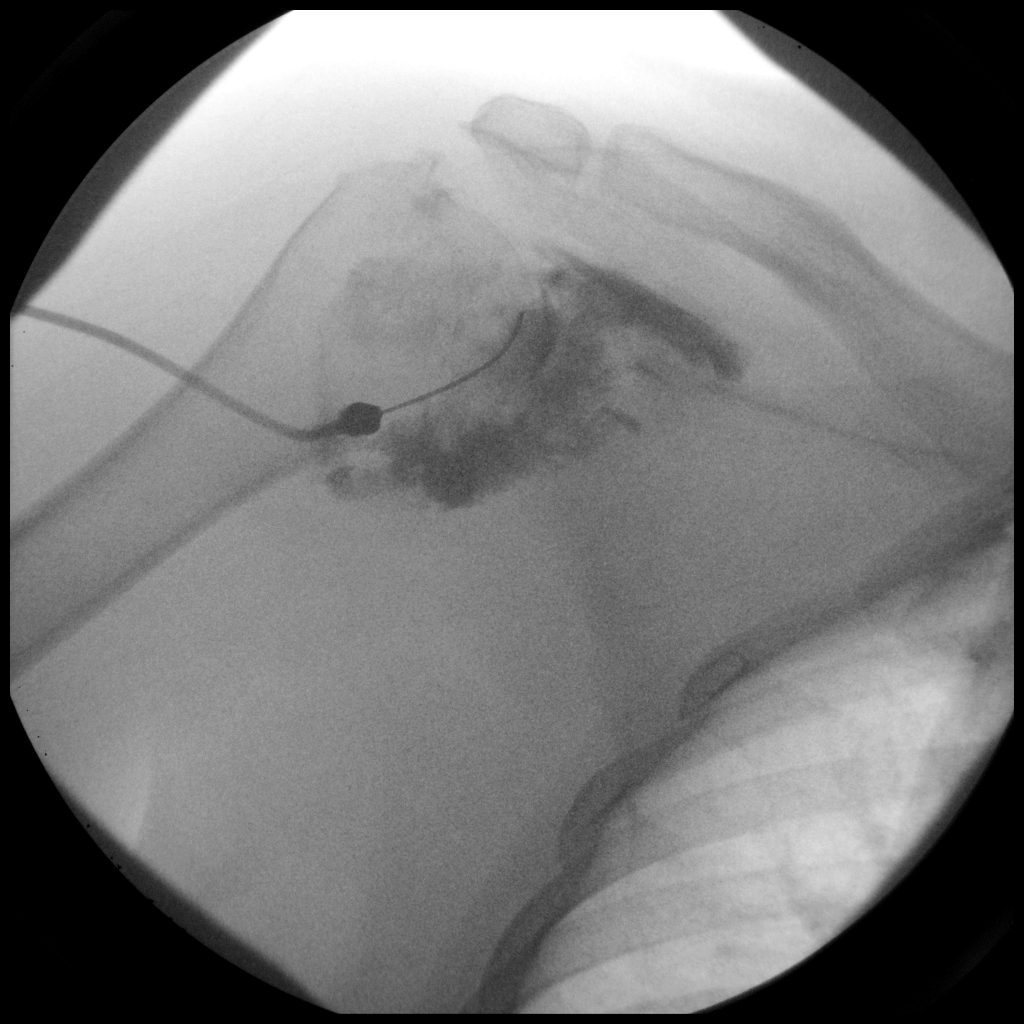

[Series 3: run · 1 of 1 slices shown (3 of 3)]
[im 1/1]
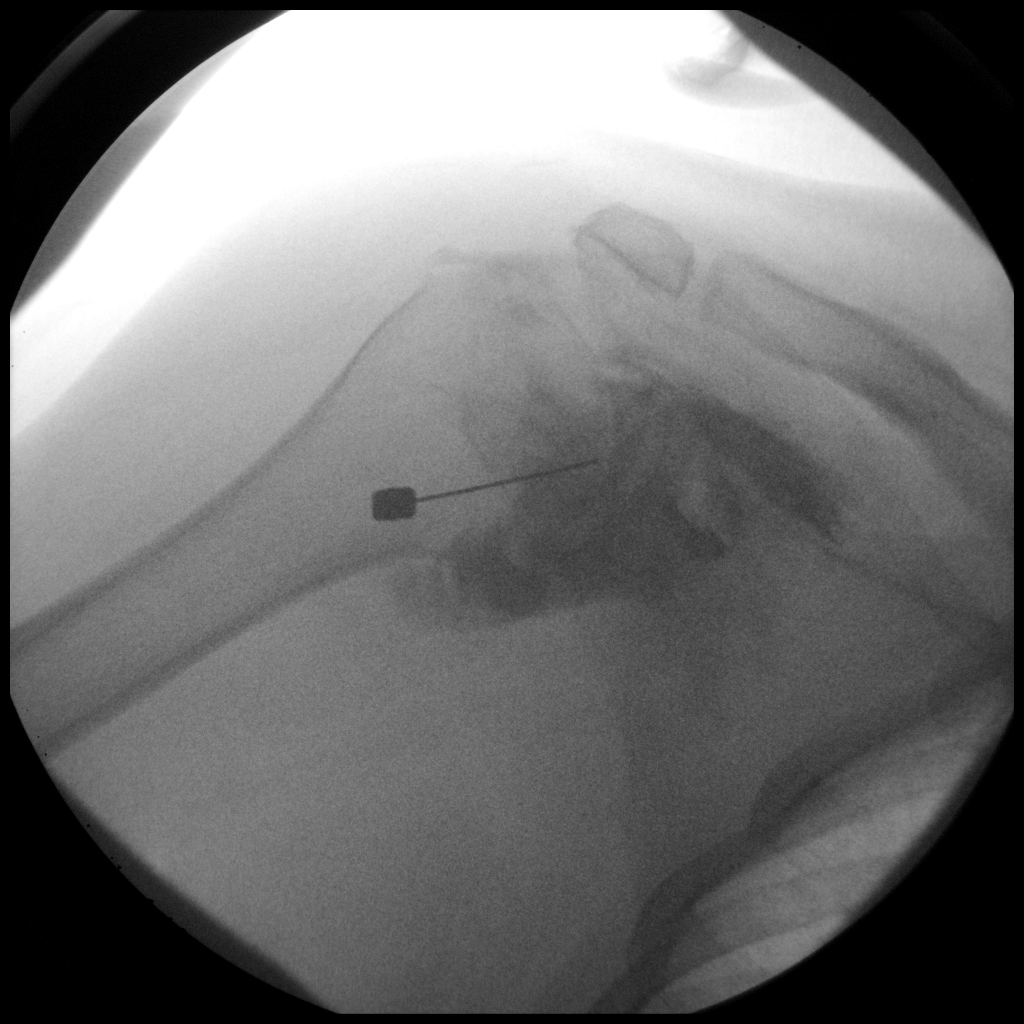

[3 of 3 positions shown; findings below may reference images not displayed]

Procedure:  I discussed the risks (including hemorrhage and
introducing infection into the glenohumeral joint, among others),
benefits, and alternatives to fluoroscopically guided left shoulder
arthrocentesis with the patient.  We discussed the good likelihood
of technical success of the procedure.  She understood and elected
to undergo the procedure.

Because of the findings in the anterior musculature on yesterday's
CT scan, I elected for a posterior approach to the glenohumeral
joint in order to avoid contaminating the joint with any of the
anterior fluid.

Following sterile skin prep and local anesthetic administration
consisting of 1% lidocaine, a 6 inches 20 gauge needle was advanced
into the upper portion of the left glenohumeral joint from a
posterior approach.  Hmnipaque-C55 contrast was injected into the
joint and demonstrated irregular joint contour suggesting
synovitis.  Initial attempts to aspirate from the joint were
unsuccessful, possibly due to blocking of the needle tip by the
inflamed synovium.  After repositioning, I aspirated 11 ml of
cloudy yellow fluid from the glenohumeral joint.  The needle was
subsequently removed and the skin cleansed and bandaged.

The syringe containing the aspirated fluid was labeled and sent to
the lab.  No immediate complications were observed.
IMPRESSION: 1.  Technically successful fluoroscopically guided left
glenohumeral joint aspiration, from posterior approach.  A total of
11 ml of cloudy yellow fluid was withdrawn from the glenohumeral
joint and sent to the lab.

## 2013-12-28 ENCOUNTER — Encounter: Payer: Self-pay | Admitting: Cardiology

## 2016-12-24 ENCOUNTER — Encounter (HOSPITAL_COMMUNITY): Payer: Self-pay

## 2016-12-24 ENCOUNTER — Ambulatory Visit (HOSPITAL_COMMUNITY)
Admission: EM | Admit: 2016-12-24 | Discharge: 2016-12-24 | Disposition: A | Discharge status: Home / Self Care | Payer: Self-pay | Attending: Family Medicine | Admitting: Family Medicine

## 2016-12-24 DIAGNOSIS — K0889 Other specified disorders of teeth and supporting structures: Secondary | ICD-10-CM

## 2016-12-24 MED ORDER — AMOXICILLIN 500 MG PO CAPS: 1000.0000 mg | ORAL_CAPSULE | Freq: Two times a day (BID) | ORAL | 0 refills | Status: AC

## 2016-12-24 MED ORDER — HYDROCODONE-ACETAMINOPHEN 5-325 MG PO TABS: 1.0000 | ORAL_TABLET | ORAL | 0 refills | Status: AC | PRN

## 2016-12-24 NOTE — ED Provider Notes (Signed)
CSN: [id number]     Arrival date & time 12/24/16  1157 History   First MD Initiated Contact with Patient 12/24/16 1253     Chief Complaint  Patient presents with  . Dental Pain   (Consider location/radiation/quality/duration/timing/severity/associated sxs/prior Treatment) 51 year old female complaining of a toothache for 2 days. She states it is one of the teeth in the left lower jaw. She has no dentist.      Past Medical History:  Diagnosis Date  . Angina   . Arrhythmia 03/22/12   "I have an irregular heartbeat all the time"  . Bifascicular block   . Complication of anesthesia    "felt like it was difficult to come out; very nauseated"  . Fibroid tumor   . Hypertension   . Left arm cellulitis, question sarcoidosis inflammatory granulomatous reaction 03/23/2012  . Ovarian cyst   . PONV (postoperative nausea and vomiting)   . PVC (premature ventricular contraction)   . Sarcoidosis ([place])   . Septic arthritis of shoulder, left ([place]) 03/24/2012   Past Surgical History:  Procedure Laterality Date  . APPENDECTOMY  1988  . BLADDER SURGERY  1966   bladder/kidney surgery to reconnect tubes.  . OVARIAN CYST REMOVAL  1986   "included right tube removed"  . RIGHT OOPHORECTOMY  1986  . SHOULDER ARTHROSCOPY  03/24/2012   Procedure: ARTHROSCOPY SHOULDER;  Surgeon: Johnny Bridge, MD;  Location: [place];  Service: Orthopedics;  Laterality: Left;  Left shoulder   . TONSILLECTOMY AND ADENOIDECTOMY  1984  . TUBAL LIGATION  1991  . UTERINE FIBROID SURGERY     "three since 76"   Family History  Problem Relation Age of Onset  . Diabetes Mother   . Heart disease Mother   . Hypertension Mother   . Diabetes Father   . Hypertension Father   . Scleroderma Father   . Anemia Sister   . Anemia Sister    Social History  Substance Use Topics  . Smoking status: Never Smoker  . Smokeless tobacco: Never Used  . Alcohol use No   OB History    No data available     Review of Systems   Constitutional: Negative.   HENT: Positive for dental problem.   Eyes: Negative.   Respiratory: Negative.   Gastrointestinal: Negative.   All other systems reviewed and are negative.   Allergies  Lisinopril  Home Medications   Prior to Admission medications   Medication Sig Start Date End Date Taking? Authorizing Provider  hydrochlorothiazide 25 MG tablet Take 1 tablet by mouth daily. 07/01/11  Yes Historical Provider, MD  hydroxychloroquine (PLAQUENIL) 200 MG tablet Take by mouth daily.   Yes Historical Provider, MD  acetaminophen (TYLENOL) 500 MG tablet Take 500 mg by mouth every 6 (six) hours as needed.    Historical Provider, MD  amoxicillin (AMOXIL) 500 MG capsule Take 2 capsules (1,000 mg total) by mouth 2 (two) times daily. 12/24/16   Janne Napoleon, NP  HYDROcodone-acetaminophen (NORCO/VICODIN) 5-325 MG tablet Take 1 tablet by mouth every 4 (four) hours as needed. 12/24/16   Janne Napoleon, NP  metoprolol tartrate (LOPRESSOR) 25 MG tablet Take 12.5 mg by mouth 2 (two) times daily.      Historical Provider, MD  predniSONE (DELTASONE) 10 MG tablet 10 mg daily. Take as directed 01/07/12   Kathee Delton, MD   Meds Ordered and Administered this Visit  Medications - No data to display  BP 181/95 (BP Location: Left Arm)   Pulse 85  Temp 100 F (37.8 C) (Oral)   Resp 16   LMP 10/14/2011   SpO2 98%  No data found.   Physical Exam  Constitutional: She is oriented to person, place, and time. She appears well-developed and well-nourished. No distress.  HENT:  Head: Normocephalic and atraumatic.  Mouth/Throat: Oropharynx is clear and moist.  Poor dentition. Several teeth are missing. Tenderness to the left lower cuspid. No surrounding erythema, gingival or buccal swelling.  Neck: Normal range of motion. Neck supple.  Pulmonary/Chest: Effort normal.  Lymphadenopathy:    She has no cervical adenopathy.  Neurological: She is alert and oriented to person, place, and time.  Skin: Skin is  warm and dry.  Nursing note and vitals reviewed.   Urgent Care Course   Clinical Course     Procedures (including critical care time)  Labs Review Labs Reviewed - No data to display  Imaging Review No results found.   Visual Acuity Review  Right Eye Distance:   Left Eye Distance:   Bilateral Distance:    Right Eye Near:   Left Eye Near:    Bilateral Near:         MDM   1. Pain, dental    Take the antibiotics and pain medicine as directed. May want to add ibuprofen for pain and inflammation if you are allowed to take this medicine. Follow-up with the dentist as soon as possible. Read attached instructions on dental pain. Meds ordered this encounter  Medications  . hydroxychloroquine (PLAQUENIL) 200 MG tablet    Sig: Take by mouth daily.  Marland Kitchen amoxicillin (AMOXIL) 500 MG capsule    Sig: Take 2 capsules (1,000 mg total) by mouth 2 (two) times daily.    Dispense:  32 capsule    Refill:  0    Order Specific Question:   Supervising Provider    Answer:   Billy Fischer [id number]  . HYDROcodone-acetaminophen (NORCO/VICODIN) 5-325 MG tablet    Sig: Take 1 tablet by mouth every 4 (four) hours as needed.    Dispense:  15 tablet    Refill:  0    Order Specific Question:   Supervising Provider    Answer:   Billy Fischer [[id number]]       Janne Napoleon, NP 12/24/16 1328

## 2016-12-24 NOTE — Discharge Instructions (Signed)
Take the antibiotics and pain medicine as directed. May want to add ibuprofen for pain and inflammation if you are allowed to take this medicine. Follow-up with the dentist as soon as possible. Read attached instructions on dental pain.

## 2016-12-24 NOTE — ED Triage Notes (Signed)
Pt has a cavity on the left side of her mouth, trouble swallowing, chewing and pain. Unsure of when she is getting the tooth pulled. No fever. Taking tylenol extra strength but still in excruciating pain.

## 2017-12-09 ENCOUNTER — Emergency Department (HOSPITAL_BASED_OUTPATIENT_CLINIC_OR_DEPARTMENT_OTHER): Payer: Self-pay

## 2017-12-09 ENCOUNTER — Emergency Department (HOSPITAL_BASED_OUTPATIENT_CLINIC_OR_DEPARTMENT_OTHER)
Admission: EM | Admit: 2017-12-09 | Discharge: 2017-12-09 | Disposition: A | Discharge status: Home / Self Care | Payer: Self-pay | Attending: Emergency Medicine | Admitting: Emergency Medicine

## 2017-12-09 ENCOUNTER — Other Ambulatory Visit: Payer: Self-pay

## 2017-12-09 ENCOUNTER — Encounter (HOSPITAL_BASED_OUTPATIENT_CLINIC_OR_DEPARTMENT_OTHER): Payer: Self-pay | Admitting: *Deleted

## 2017-12-09 DIAGNOSIS — I10 Essential (primary) hypertension: Secondary | ICD-10-CM | POA: Insufficient documentation

## 2017-12-09 DIAGNOSIS — R079 Chest pain, unspecified: Secondary | ICD-10-CM | POA: Insufficient documentation

## 2017-12-09 DIAGNOSIS — Z79899 Other long term (current) drug therapy: Secondary | ICD-10-CM | POA: Insufficient documentation

## 2017-12-09 DIAGNOSIS — R531 Weakness: Secondary | ICD-10-CM | POA: Insufficient documentation

## 2017-12-09 LAB — BASIC METABOLIC PANEL
Anion gap: 10 (ref 5–15)
BUN: 16 mg/dL (ref 6–20)
CO2: 29 mmol/L (ref 22–32)
Calcium: 10.4 mg/dL — ABNORMAL HIGH (ref 8.9–10.3)
Chloride: 101 mmol/L (ref 101–111)
Creatinine, Ser: 0.82 mg/dL (ref 0.44–1.00)
GFR calc Af Amer: >60 mL/min (ref >60)
GFR calc non Af Amer: >60 mL/min (ref >60)
Glucose, Bld: 191 mg/dL — ABNORMAL HIGH (ref 65–99)
Potassium: 4.1 mmol/L (ref 3.5–5.1)
Sodium: 140 mmol/L (ref 135–145)

## 2017-12-09 LAB — CBC
HCT: 43.1 % (ref 36.0–46.0)
Hemoglobin: 13.8 g/dL (ref 12.0–15.0)
MCH: 26.9 pg (ref 26.0–34.0)
MCHC: 32 g/dL (ref 30.0–36.0)
MCV: 84 fL (ref 78.0–100.0)
Platelets: 283 10*3/uL (ref 150–400)
RBC: 5.13 MIL/uL — ABNORMAL HIGH (ref 3.87–5.11)
RDW: 13.9 % (ref 11.5–15.5)
WBC: 6.5 10*3/uL (ref 4.0–10.5)

## 2017-12-09 LAB — TROPONIN I
Troponin I: <0.03 ng/mL (ref <0.03)
Troponin I: <0.03 ng/mL (ref <0.03)

## 2017-12-09 MED ORDER — PREDNISONE 20 MG PO TABS: 40.0000 mg | ORAL_TABLET | Freq: Every day | ORAL | 0 refills | Status: AC

## 2017-12-09 MED ORDER — TRAMADOL HCL 50 MG PO TABS: 50.0000 mg | ORAL_TABLET | Freq: Four times a day (QID) | ORAL | 0 refills | Status: AC | PRN

## 2017-12-09 MED FILL — predniSONE 20 MG TABS: 20 | 5 days supply | Qty: 10 | Fill #0

## 2017-12-09 MED FILL — traMADol HCL 50 MG TABS: 50 | 3 days supply | Qty: 10 | Fill #0

## 2017-12-09 NOTE — ED Notes (Signed)
NAD at this time. Pt is stable and going home.  

## 2017-12-09 NOTE — ED Provider Notes (Signed)
[place] EMERGENCY DEPARTMENT Provider Note   CSN: [id number] Arrival date & time: 12/09/17  1118     History   Chief Complaint Chief Complaint  Patient presents with  . Chest Pain  . Weakness    HPI [name] is a 51 y.o. female.  HPI   51 year old female with chest pain.  Onset about a week ago. Feels like "someone has their thumb pressing on my chest" in small area just adjacent to her left breast.  No appreciable exacerbating relieving factors.  Generalized weakness. Feels like she has no energy. Mild intermittent shortness of breath.  Intermittent headaches.  She has a past history of sarcoidosis.  She is questioning if she might be having a flare.  She states that she has had similar symptoms like this previously.  No known history of any cardiac disease that she is aware of.  No unusual swelling.  No fevers or chills.  No cough.  She has not tried taking anything for her symptoms.  Past Medical History:  Diagnosis Date  . Angina   . Arrhythmia 03/22/12   "I have an irregular heartbeat all the time"  . Bifascicular block   . Complication of anesthesia    "felt like it was difficult to come out; very nauseated"  . Fibroid tumor   . Hypertension   . Left arm cellulitis, question sarcoidosis inflammatory granulomatous reaction 03/23/2012  . Ovarian cyst   . PONV (postoperative nausea and vomiting)   . PVC (premature ventricular contraction)   . Sarcoidosis   . Septic arthritis of shoulder, left ([place]) 03/24/2012    Patient Active Problem List   Diagnosis Date Noted  . Septic arthritis of shoulder, left ([place]) 03/24/2012  . Left arm cellulitis, question sarcoidosis inflammatory granulomatous reaction 03/23/2012  . Chest pain 07/19/2011  . Sarcoidosis 07/01/2011  . Hypertension   . PVC (premature ventricular contraction)   . Bifascicular block     Past Surgical History:  Procedure Laterality Date  . APPENDECTOMY  1988  . BLADDER SURGERY  1966   bladder/kidney surgery to reconnect tubes.  . OVARIAN CYST REMOVAL  1986   "included right tube removed"  . RIGHT OOPHORECTOMY  1986  . SHOULDER ARTHROSCOPY  03/24/2012   Procedure: ARTHROSCOPY SHOULDER;  Surgeon: Johnny Bridge, MD;  Location: [place];  Service: Orthopedics;  Laterality: Left;  Left shoulder   . TONSILLECTOMY AND ADENOIDECTOMY  1984  . TUBAL LIGATION  1991  . UTERINE FIBROID SURGERY     "three since 1986"    OB History    No data available       Home Medications    Prior to Admission medications   Medication Sig Start Date End Date Taking? Authorizing Provider  metoprolol tartrate (LOPRESSOR) 25 MG tablet Take 12.5 mg by mouth 2 (two) times daily.     Yes [provider]  acetaminophen (TYLENOL) 500 MG tablet Take 500 mg by mouth every 6 (six) hours as needed.    [provider]  amoxicillin (AMOXIL) 500 MG capsule Take 2 capsules (1,000 mg total) by mouth 2 (two) times daily. 12/24/16   Janne Napoleon, NP  hydrochlorothiazide 25 MG tablet Take 1 tablet by mouth daily. 07/01/11   [provider]  HYDROcodone-acetaminophen (NORCO/VICODIN) 5-325 MG tablet Take 1 tablet by mouth every 4 (four) hours as needed. 12/24/16   Janne Napoleon, NP  hydroxychloroquine (PLAQUENIL) 200 MG tablet Take by mouth daily.    [provider]  predniSONE (DELTASONE) 10 MG tablet 10 mg daily. Take as directed 01/07/12   Clance, Armando Reichert, MD    Family History Family History  Problem Relation Age of Onset  . Diabetes Mother   . Heart disease Mother   . Hypertension Mother   . Diabetes Father   . Hypertension Father   . Scleroderma Father   . Anemia Sister   . Anemia Sister     Social History Social History   Tobacco Use  . Smoking status: Never Smoker  . Smokeless tobacco: Never Used  Substance Use Topics  . Alcohol use: No  . Drug use: No     Allergies   Lisinopril   Review of Systems Review of Systems  All systems reviewed and  negative, other than as noted in HPI.  Physical Exam Updated Vital Signs BP 132/90   Pulse 73   Temp 98.1 F (36.7 C) (Oral)   Resp 15   Ht 5\' 11"  (1.803 m)   Wt (!) 149.7 kg (330 lb)   LMP 10/14/2011   SpO2 94%   BMI 46.03 kg/m   Physical Exam  Constitutional: She appears well-developed and well-nourished. No distress.  Laying in bed. Appears tired but not toxic. Obese.   HENT:  Head: Normocephalic and atraumatic.  Eyes: Conjunctivae are normal. Right eye exhibits no discharge. Left eye exhibits no discharge.  Neck: Neck supple.  Cardiovascular: Normal rate, regular rhythm and normal heart sounds. Exam reveals no gallop and no friction rub.  No murmur heard. Pulmonary/Chest: Effort normal and breath sounds normal. No respiratory distress.  Abdominal: Soft. She exhibits no distension. There is no tenderness.  Musculoskeletal: She exhibits no edema or tenderness.  Neurological: She is alert.  Skin: Skin is warm and dry.  Psychiatric: She has a normal mood and affect. Her behavior is normal. Thought content normal.  Nursing note and vitals reviewed.    ED Treatments / Results  Labs (all labs ordered are listed, but only abnormal results are displayed) Labs Reviewed  BASIC METABOLIC PANEL - Abnormal; Notable for the following components:      Result Value   Glucose, Bld 191 (*)    Calcium 10.4 (*)    All other components within normal limits  CBC - Abnormal; Notable for the following components:   RBC 5.13 (*)    All other components within normal limits  TROPONIN I  TROPONIN I    EKG  EKG Interpretation  Date/Time:  Friday December 09 2017 14:30:25 EST Ventricular Rate:  75 PR Interval:    QRS Duration: 156 QT Interval:  440 QTC Calculation: 492 R Axis:   -82 Text Interpretation:  Sinus rhythm RBBB and LAFB Left ventricular hypertrophy No significant change since last tracing Confirmed by Virgel Manifold [phone]) on 12/09/2017 3:00:25 PM        Radiology Dg Chest 2 View  Result Date: 12/09/2017 CLINICAL DATA:  Chest pain for 1 week EXAM: CHEST  2 VIEW COMPARISON:  05/12/2012 FINDINGS: Cardiac shadow is within normal limits. The lungs are well aerated bilaterally. No focal infiltrate or sizable effusion is seen. Previously noted hilar prominence is less marked on the current exam. No bony abnormality is seen. IMPRESSION: No active cardiopulmonary disease. Electronically Signed   By: Inez Catalina M.D.   On: 12/09/2017 12:01    Procedures Procedures (including critical care time)  Medications Ordered in ED Medications - No data to display   Initial Impression / Assessment and Plan / ED Course  I have reviewed the triage vital signs and the nursing notes.  Pertinent labs & imaging results that were available during my care of the patient were reviewed by me and considered in my medical decision making (see chart for details).     51 year old female with chest pain.  Seems atypical for ACS given the duration of her symptoms for about a week.  EKG is abnormal but nonspecific.  Troponin normal x2.  She is no increased work of breathing.  Chest x-ray is without acute abnormality.  Unsure symptoms may be related to underlying sarcoidosis.  She is not sure if she has pulmonary involvement. Reagrdless, I doubt emergent process.  It has been determined that no acute conditions requiring further emergency intervention are present at this time. The patient has been advised of the diagnosis and plan. I reviewed any labs and imaging including any potential incidental findings. We have discussed signs and symptoms that warrant return to the ED and they are listed in the discharge instructions.    Final Clinical Impressions(s) / ED Diagnoses   Final diagnoses:  Generalized weakness  Chest pain, unspecified type    ED Discharge Orders    None       Virgel Manifold, MD 12/13/17 [phone]

## 2017-12-09 NOTE — ED Triage Notes (Signed)
Chest pain, weakness and headache x 1 week. Hx sarcodosis.
